# Patient Record
Sex: Male | Born: 1935 | Race: White | Hispanic: No | Marital: Married | Smoking: Former smoker
Health system: Southern US, Community
[De-identification: ages and names within clinical notes are randomized; demographics above are authoritative.]

## PROBLEM LIST (undated history)

## (undated) DIAGNOSIS — M109 Gout, unspecified: Secondary | ICD-10-CM

## (undated) DIAGNOSIS — K5790 Diverticulosis of intestine, part unspecified, without perforation or abscess without bleeding: Secondary | ICD-10-CM

## (undated) DIAGNOSIS — E7800 Pure hypercholesterolemia, unspecified: Secondary | ICD-10-CM

## (undated) DIAGNOSIS — J309 Allergic rhinitis, unspecified: Secondary | ICD-10-CM

## (undated) DIAGNOSIS — E039 Hypothyroidism, unspecified: Secondary | ICD-10-CM

## (undated) DIAGNOSIS — K219 Gastro-esophageal reflux disease without esophagitis: Secondary | ICD-10-CM

## (undated) DIAGNOSIS — M792 Neuralgia and neuritis, unspecified: Secondary | ICD-10-CM

## (undated) DIAGNOSIS — N411 Chronic prostatitis: Secondary | ICD-10-CM

## (undated) DIAGNOSIS — Z6827 Body mass index (BMI) 27.0-27.9, adult: Secondary | ICD-10-CM

## (undated) DIAGNOSIS — M159 Polyosteoarthritis, unspecified: Secondary | ICD-10-CM

## (undated) DIAGNOSIS — N189 Chronic kidney disease, unspecified: Secondary | ICD-10-CM

## (undated) DIAGNOSIS — I4891 Unspecified atrial fibrillation: Secondary | ICD-10-CM

## (undated) DIAGNOSIS — I10 Essential (primary) hypertension: Secondary | ICD-10-CM

## (undated) HISTORY — DX: Diverticulosis of intestine, part unspecified, without perforation or abscess without bleeding: K57.90

## (undated) HISTORY — DX: Essential (primary) hypertension: I10

## (undated) HISTORY — DX: Chronic kidney disease, unspecified: N18.9

## (undated) HISTORY — PX: APPENDECTOMY: SHX54

## (undated) HISTORY — DX: Chronic prostatitis: N41.1

## (undated) HISTORY — DX: Body mass index (BMI) 27.0-27.9, adult: Z68.27

## (undated) HISTORY — DX: Gout, unspecified: M10.9

## (undated) HISTORY — DX: Pure hypercholesterolemia, unspecified: E78.00

## (undated) HISTORY — DX: Polyosteoarthritis, unspecified: M15.9

## (undated) HISTORY — DX: Allergic rhinitis, unspecified: J30.9

## (undated) HISTORY — DX: Gastro-esophageal reflux disease without esophagitis: K21.9

## (undated) HISTORY — DX: Neuralgia and neuritis, unspecified: M79.2

## (undated) HISTORY — DX: Unspecified atrial fibrillation: I48.91

## (undated) HISTORY — DX: Hypothyroidism, unspecified: E03.9

---

## 2001-02-06 HISTORY — PX: CATARACT EXTRACTION: SUR2

## 2005-02-06 HISTORY — PX: CATARACT EXTRACTION: SUR2

## 2011-02-13 DIAGNOSIS — K219 Gastro-esophageal reflux disease without esophagitis: Secondary | ICD-10-CM | POA: Diagnosis not present

## 2011-02-13 DIAGNOSIS — M159 Polyosteoarthritis, unspecified: Secondary | ICD-10-CM | POA: Diagnosis not present

## 2011-02-13 DIAGNOSIS — I10 Essential (primary) hypertension: Secondary | ICD-10-CM | POA: Diagnosis not present

## 2011-02-13 DIAGNOSIS — N189 Chronic kidney disease, unspecified: Secondary | ICD-10-CM | POA: Diagnosis not present

## 2011-02-13 DIAGNOSIS — E039 Hypothyroidism, unspecified: Secondary | ICD-10-CM | POA: Diagnosis not present

## 2011-02-13 DIAGNOSIS — Z23 Encounter for immunization: Secondary | ICD-10-CM | POA: Diagnosis not present

## 2011-02-22 DIAGNOSIS — H905 Unspecified sensorineural hearing loss: Secondary | ICD-10-CM | POA: Diagnosis not present

## 2011-02-22 DIAGNOSIS — H7200 Central perforation of tympanic membrane, unspecified ear: Secondary | ICD-10-CM | POA: Diagnosis not present

## 2011-02-22 DIAGNOSIS — H6980 Other specified disorders of Eustachian tube, unspecified ear: Secondary | ICD-10-CM | POA: Diagnosis not present

## 2011-05-15 DIAGNOSIS — H908 Mixed conductive and sensorineural hearing loss, unspecified: Secondary | ICD-10-CM | POA: Diagnosis not present

## 2011-05-15 DIAGNOSIS — H6980 Other specified disorders of Eustachian tube, unspecified ear: Secondary | ICD-10-CM | POA: Diagnosis not present

## 2011-05-15 DIAGNOSIS — H73819 Atrophic flaccid tympanic membrane, unspecified ear: Secondary | ICD-10-CM | POA: Diagnosis not present

## 2011-05-17 DIAGNOSIS — E039 Hypothyroidism, unspecified: Secondary | ICD-10-CM | POA: Diagnosis not present

## 2011-05-17 DIAGNOSIS — M159 Polyosteoarthritis, unspecified: Secondary | ICD-10-CM | POA: Diagnosis not present

## 2011-05-17 DIAGNOSIS — I10 Essential (primary) hypertension: Secondary | ICD-10-CM | POA: Diagnosis not present

## 2011-05-17 DIAGNOSIS — E7800 Pure hypercholesterolemia, unspecified: Secondary | ICD-10-CM | POA: Diagnosis not present

## 2011-08-23 DIAGNOSIS — E7800 Pure hypercholesterolemia, unspecified: Secondary | ICD-10-CM | POA: Diagnosis not present

## 2011-08-23 DIAGNOSIS — M159 Polyosteoarthritis, unspecified: Secondary | ICD-10-CM | POA: Diagnosis not present

## 2011-08-23 DIAGNOSIS — E039 Hypothyroidism, unspecified: Secondary | ICD-10-CM | POA: Diagnosis not present

## 2011-08-23 DIAGNOSIS — I10 Essential (primary) hypertension: Secondary | ICD-10-CM | POA: Diagnosis not present

## 2011-08-23 DIAGNOSIS — Z23 Encounter for immunization: Secondary | ICD-10-CM | POA: Diagnosis not present

## 2011-11-22 DIAGNOSIS — Z6825 Body mass index (BMI) 25.0-25.9, adult: Secondary | ICD-10-CM | POA: Diagnosis not present

## 2011-11-22 DIAGNOSIS — E7800 Pure hypercholesterolemia, unspecified: Secondary | ICD-10-CM | POA: Diagnosis not present

## 2011-11-22 DIAGNOSIS — E039 Hypothyroidism, unspecified: Secondary | ICD-10-CM | POA: Diagnosis not present

## 2011-11-22 DIAGNOSIS — Z23 Encounter for immunization: Secondary | ICD-10-CM | POA: Diagnosis not present

## 2011-11-22 DIAGNOSIS — I10 Essential (primary) hypertension: Secondary | ICD-10-CM | POA: Diagnosis not present

## 2011-11-22 DIAGNOSIS — K219 Gastro-esophageal reflux disease without esophagitis: Secondary | ICD-10-CM | POA: Diagnosis not present

## 2011-11-22 DIAGNOSIS — M159 Polyosteoarthritis, unspecified: Secondary | ICD-10-CM | POA: Diagnosis not present

## 2011-12-01 DIAGNOSIS — H6690 Otitis media, unspecified, unspecified ear: Secondary | ICD-10-CM | POA: Diagnosis not present

## 2011-12-15 DIAGNOSIS — H905 Unspecified sensorineural hearing loss: Secondary | ICD-10-CM | POA: Diagnosis not present

## 2011-12-15 DIAGNOSIS — H6980 Other specified disorders of Eustachian tube, unspecified ear: Secondary | ICD-10-CM | POA: Diagnosis not present

## 2012-01-01 DIAGNOSIS — Z961 Presence of intraocular lens: Secondary | ICD-10-CM | POA: Diagnosis not present

## 2012-01-01 DIAGNOSIS — H353190 Nonexudative age-related macular degeneration, unspecified eye, stage unspecified: Secondary | ICD-10-CM | POA: Diagnosis not present

## 2012-02-23 DIAGNOSIS — Z1331 Encounter for screening for depression: Secondary | ICD-10-CM | POA: Diagnosis not present

## 2012-02-23 DIAGNOSIS — Z6825 Body mass index (BMI) 25.0-25.9, adult: Secondary | ICD-10-CM | POA: Diagnosis not present

## 2012-02-23 DIAGNOSIS — E7800 Pure hypercholesterolemia, unspecified: Secondary | ICD-10-CM | POA: Diagnosis not present

## 2012-02-23 DIAGNOSIS — Z9181 History of falling: Secondary | ICD-10-CM | POA: Diagnosis not present

## 2012-02-23 DIAGNOSIS — E039 Hypothyroidism, unspecified: Secondary | ICD-10-CM | POA: Diagnosis not present

## 2012-02-23 DIAGNOSIS — I10 Essential (primary) hypertension: Secondary | ICD-10-CM | POA: Diagnosis not present

## 2012-02-23 DIAGNOSIS — M159 Polyosteoarthritis, unspecified: Secondary | ICD-10-CM | POA: Diagnosis not present

## 2012-03-08 DIAGNOSIS — M159 Polyosteoarthritis, unspecified: Secondary | ICD-10-CM | POA: Diagnosis not present

## 2012-03-08 DIAGNOSIS — Z6825 Body mass index (BMI) 25.0-25.9, adult: Secondary | ICD-10-CM | POA: Diagnosis not present

## 2012-03-08 DIAGNOSIS — N189 Chronic kidney disease, unspecified: Secondary | ICD-10-CM | POA: Diagnosis not present

## 2012-03-08 DIAGNOSIS — E039 Hypothyroidism, unspecified: Secondary | ICD-10-CM | POA: Diagnosis not present

## 2012-05-31 DIAGNOSIS — N189 Chronic kidney disease, unspecified: Secondary | ICD-10-CM | POA: Diagnosis not present

## 2012-05-31 DIAGNOSIS — M159 Polyosteoarthritis, unspecified: Secondary | ICD-10-CM | POA: Diagnosis not present

## 2012-05-31 DIAGNOSIS — Z6825 Body mass index (BMI) 25.0-25.9, adult: Secondary | ICD-10-CM | POA: Diagnosis not present

## 2012-05-31 DIAGNOSIS — E039 Hypothyroidism, unspecified: Secondary | ICD-10-CM | POA: Diagnosis not present

## 2012-05-31 DIAGNOSIS — E7800 Pure hypercholesterolemia, unspecified: Secondary | ICD-10-CM | POA: Diagnosis not present

## 2012-05-31 DIAGNOSIS — I10 Essential (primary) hypertension: Secondary | ICD-10-CM | POA: Diagnosis not present

## 2012-07-12 DIAGNOSIS — Z6825 Body mass index (BMI) 25.0-25.9, adult: Secondary | ICD-10-CM | POA: Diagnosis not present

## 2012-07-12 DIAGNOSIS — M159 Polyosteoarthritis, unspecified: Secondary | ICD-10-CM | POA: Diagnosis not present

## 2012-07-12 DIAGNOSIS — N189 Chronic kidney disease, unspecified: Secondary | ICD-10-CM | POA: Diagnosis not present

## 2012-07-12 DIAGNOSIS — E039 Hypothyroidism, unspecified: Secondary | ICD-10-CM | POA: Diagnosis not present

## 2012-07-26 DIAGNOSIS — Z6825 Body mass index (BMI) 25.0-25.9, adult: Secondary | ICD-10-CM | POA: Diagnosis not present

## 2012-07-26 DIAGNOSIS — M109 Gout, unspecified: Secondary | ICD-10-CM | POA: Diagnosis not present

## 2012-07-26 DIAGNOSIS — E039 Hypothyroidism, unspecified: Secondary | ICD-10-CM | POA: Diagnosis not present

## 2012-07-26 DIAGNOSIS — L0291 Cutaneous abscess, unspecified: Secondary | ICD-10-CM | POA: Diagnosis not present

## 2012-07-26 DIAGNOSIS — L0390 Cellulitis, unspecified: Secondary | ICD-10-CM | POA: Diagnosis not present

## 2012-07-31 DIAGNOSIS — L0291 Cutaneous abscess, unspecified: Secondary | ICD-10-CM | POA: Diagnosis not present

## 2012-07-31 DIAGNOSIS — L02519 Cutaneous abscess of unspecified hand: Secondary | ICD-10-CM | POA: Diagnosis not present

## 2012-07-31 DIAGNOSIS — E039 Hypothyroidism, unspecified: Secondary | ICD-10-CM | POA: Diagnosis not present

## 2012-07-31 DIAGNOSIS — L0390 Cellulitis, unspecified: Secondary | ICD-10-CM | POA: Diagnosis not present

## 2012-07-31 DIAGNOSIS — Z6825 Body mass index (BMI) 25.0-25.9, adult: Secondary | ICD-10-CM | POA: Diagnosis not present

## 2012-07-31 DIAGNOSIS — M109 Gout, unspecified: Secondary | ICD-10-CM | POA: Diagnosis not present

## 2012-08-07 DIAGNOSIS — M109 Gout, unspecified: Secondary | ICD-10-CM | POA: Diagnosis not present

## 2012-08-07 DIAGNOSIS — IMO0002 Reserved for concepts with insufficient information to code with codable children: Secondary | ICD-10-CM | POA: Diagnosis not present

## 2012-08-07 DIAGNOSIS — E039 Hypothyroidism, unspecified: Secondary | ICD-10-CM | POA: Diagnosis not present

## 2012-08-07 DIAGNOSIS — M159 Polyosteoarthritis, unspecified: Secondary | ICD-10-CM | POA: Diagnosis not present

## 2012-09-04 DIAGNOSIS — IMO0002 Reserved for concepts with insufficient information to code with codable children: Secondary | ICD-10-CM | POA: Diagnosis not present

## 2012-09-04 DIAGNOSIS — E039 Hypothyroidism, unspecified: Secondary | ICD-10-CM | POA: Diagnosis not present

## 2012-09-04 DIAGNOSIS — I10 Essential (primary) hypertension: Secondary | ICD-10-CM | POA: Diagnosis not present

## 2012-09-04 DIAGNOSIS — E7800 Pure hypercholesterolemia, unspecified: Secondary | ICD-10-CM | POA: Diagnosis not present

## 2012-09-04 DIAGNOSIS — M159 Polyosteoarthritis, unspecified: Secondary | ICD-10-CM | POA: Diagnosis not present

## 2012-09-13 DIAGNOSIS — N183 Chronic kidney disease, stage 3 unspecified: Secondary | ICD-10-CM | POA: Diagnosis not present

## 2012-09-13 DIAGNOSIS — I129 Hypertensive chronic kidney disease with stage 1 through stage 4 chronic kidney disease, or unspecified chronic kidney disease: Secondary | ICD-10-CM | POA: Diagnosis not present

## 2012-10-08 DIAGNOSIS — L821 Other seborrheic keratosis: Secondary | ICD-10-CM | POA: Diagnosis not present

## 2012-10-08 DIAGNOSIS — L570 Actinic keratosis: Secondary | ICD-10-CM | POA: Diagnosis not present

## 2012-10-16 DIAGNOSIS — IMO0002 Reserved for concepts with insufficient information to code with codable children: Secondary | ICD-10-CM | POA: Diagnosis not present

## 2012-10-16 DIAGNOSIS — M159 Polyosteoarthritis, unspecified: Secondary | ICD-10-CM | POA: Diagnosis not present

## 2012-10-16 DIAGNOSIS — E039 Hypothyroidism, unspecified: Secondary | ICD-10-CM | POA: Diagnosis not present

## 2012-10-16 DIAGNOSIS — L0291 Cutaneous abscess, unspecified: Secondary | ICD-10-CM | POA: Diagnosis not present

## 2012-12-05 DIAGNOSIS — I10 Essential (primary) hypertension: Secondary | ICD-10-CM | POA: Diagnosis not present

## 2012-12-05 DIAGNOSIS — IMO0002 Reserved for concepts with insufficient information to code with codable children: Secondary | ICD-10-CM | POA: Diagnosis not present

## 2012-12-05 DIAGNOSIS — M159 Polyosteoarthritis, unspecified: Secondary | ICD-10-CM | POA: Diagnosis not present

## 2012-12-05 DIAGNOSIS — E7800 Pure hypercholesterolemia, unspecified: Secondary | ICD-10-CM | POA: Diagnosis not present

## 2012-12-05 DIAGNOSIS — E039 Hypothyroidism, unspecified: Secondary | ICD-10-CM | POA: Diagnosis not present

## 2012-12-05 DIAGNOSIS — Z23 Encounter for immunization: Secondary | ICD-10-CM | POA: Diagnosis not present

## 2013-01-09 DIAGNOSIS — H3581 Retinal edema: Secondary | ICD-10-CM | POA: Diagnosis not present

## 2013-01-09 DIAGNOSIS — H348192 Central retinal vein occlusion, unspecified eye, stable: Secondary | ICD-10-CM | POA: Diagnosis not present

## 2013-01-09 DIAGNOSIS — Z961 Presence of intraocular lens: Secondary | ICD-10-CM | POA: Diagnosis not present

## 2013-01-09 DIAGNOSIS — H35039 Hypertensive retinopathy, unspecified eye: Secondary | ICD-10-CM | POA: Diagnosis not present

## 2013-01-09 DIAGNOSIS — H353190 Nonexudative age-related macular degeneration, unspecified eye, stage unspecified: Secondary | ICD-10-CM | POA: Diagnosis not present

## 2013-02-10 DIAGNOSIS — M159 Polyosteoarthritis, unspecified: Secondary | ICD-10-CM | POA: Diagnosis not present

## 2013-02-10 DIAGNOSIS — E039 Hypothyroidism, unspecified: Secondary | ICD-10-CM | POA: Diagnosis not present

## 2013-02-10 DIAGNOSIS — E7800 Pure hypercholesterolemia, unspecified: Secondary | ICD-10-CM | POA: Diagnosis not present

## 2013-02-10 DIAGNOSIS — K219 Gastro-esophageal reflux disease without esophagitis: Secondary | ICD-10-CM | POA: Diagnosis not present

## 2013-02-13 DIAGNOSIS — H3581 Retinal edema: Secondary | ICD-10-CM | POA: Diagnosis not present

## 2013-02-13 DIAGNOSIS — D3130 Benign neoplasm of unspecified choroid: Secondary | ICD-10-CM | POA: Diagnosis not present

## 2013-02-13 DIAGNOSIS — H348192 Central retinal vein occlusion, unspecified eye, stable: Secondary | ICD-10-CM | POA: Diagnosis not present

## 2013-02-18 DIAGNOSIS — E039 Hypothyroidism, unspecified: Secondary | ICD-10-CM | POA: Diagnosis not present

## 2013-02-18 DIAGNOSIS — M109 Gout, unspecified: Secondary | ICD-10-CM | POA: Diagnosis not present

## 2013-02-18 DIAGNOSIS — M159 Polyosteoarthritis, unspecified: Secondary | ICD-10-CM | POA: Diagnosis not present

## 2013-02-18 DIAGNOSIS — Z1331 Encounter for screening for depression: Secondary | ICD-10-CM | POA: Diagnosis not present

## 2013-02-18 DIAGNOSIS — I10 Essential (primary) hypertension: Secondary | ICD-10-CM | POA: Diagnosis not present

## 2013-02-28 DIAGNOSIS — I10 Essential (primary) hypertension: Secondary | ICD-10-CM | POA: Diagnosis not present

## 2013-02-28 DIAGNOSIS — Z9181 History of falling: Secondary | ICD-10-CM | POA: Diagnosis not present

## 2013-02-28 DIAGNOSIS — M159 Polyosteoarthritis, unspecified: Secondary | ICD-10-CM | POA: Diagnosis not present

## 2013-02-28 DIAGNOSIS — K219 Gastro-esophageal reflux disease without esophagitis: Secondary | ICD-10-CM | POA: Diagnosis not present

## 2013-02-28 DIAGNOSIS — Z1331 Encounter for screening for depression: Secondary | ICD-10-CM | POA: Diagnosis not present

## 2013-02-28 DIAGNOSIS — E039 Hypothyroidism, unspecified: Secondary | ICD-10-CM | POA: Diagnosis not present

## 2013-03-19 DIAGNOSIS — H3581 Retinal edema: Secondary | ICD-10-CM | POA: Diagnosis not present

## 2013-03-19 DIAGNOSIS — H348192 Central retinal vein occlusion, unspecified eye, stable: Secondary | ICD-10-CM | POA: Diagnosis not present

## 2013-03-19 DIAGNOSIS — H35359 Cystoid macular degeneration, unspecified eye: Secondary | ICD-10-CM | POA: Diagnosis not present

## 2013-04-10 DIAGNOSIS — N189 Chronic kidney disease, unspecified: Secondary | ICD-10-CM | POA: Diagnosis not present

## 2013-04-10 DIAGNOSIS — E039 Hypothyroidism, unspecified: Secondary | ICD-10-CM | POA: Diagnosis not present

## 2013-04-10 DIAGNOSIS — I10 Essential (primary) hypertension: Secondary | ICD-10-CM | POA: Diagnosis not present

## 2013-04-10 DIAGNOSIS — M159 Polyosteoarthritis, unspecified: Secondary | ICD-10-CM | POA: Diagnosis not present

## 2013-04-23 DIAGNOSIS — H905 Unspecified sensorineural hearing loss: Secondary | ICD-10-CM | POA: Diagnosis not present

## 2013-04-23 DIAGNOSIS — H6980 Other specified disorders of Eustachian tube, unspecified ear: Secondary | ICD-10-CM | POA: Diagnosis not present

## 2013-04-23 DIAGNOSIS — H60509 Unspecified acute noninfective otitis externa, unspecified ear: Secondary | ICD-10-CM | POA: Diagnosis not present

## 2013-04-23 DIAGNOSIS — H9210 Otorrhea, unspecified ear: Secondary | ICD-10-CM | POA: Diagnosis not present

## 2013-04-30 DIAGNOSIS — H348192 Central retinal vein occlusion, unspecified eye, stable: Secondary | ICD-10-CM | POA: Diagnosis not present

## 2013-04-30 DIAGNOSIS — H3581 Retinal edema: Secondary | ICD-10-CM | POA: Diagnosis not present

## 2013-04-30 DIAGNOSIS — H3560 Retinal hemorrhage, unspecified eye: Secondary | ICD-10-CM | POA: Diagnosis not present

## 2013-04-30 DIAGNOSIS — H35359 Cystoid macular degeneration, unspecified eye: Secondary | ICD-10-CM | POA: Diagnosis not present

## 2013-05-16 DIAGNOSIS — H10509 Unspecified blepharoconjunctivitis, unspecified eye: Secondary | ICD-10-CM | POA: Diagnosis not present

## 2013-05-29 DIAGNOSIS — M109 Gout, unspecified: Secondary | ICD-10-CM | POA: Diagnosis not present

## 2013-05-29 DIAGNOSIS — E039 Hypothyroidism, unspecified: Secondary | ICD-10-CM | POA: Diagnosis not present

## 2013-05-29 DIAGNOSIS — M159 Polyosteoarthritis, unspecified: Secondary | ICD-10-CM | POA: Diagnosis not present

## 2013-05-29 DIAGNOSIS — I10 Essential (primary) hypertension: Secondary | ICD-10-CM | POA: Diagnosis not present

## 2013-06-04 DIAGNOSIS — H348192 Central retinal vein occlusion, unspecified eye, stable: Secondary | ICD-10-CM | POA: Diagnosis not present

## 2013-06-04 DIAGNOSIS — H3581 Retinal edema: Secondary | ICD-10-CM | POA: Diagnosis not present

## 2013-06-10 DIAGNOSIS — E7800 Pure hypercholesterolemia, unspecified: Secondary | ICD-10-CM | POA: Diagnosis not present

## 2013-06-10 DIAGNOSIS — I10 Essential (primary) hypertension: Secondary | ICD-10-CM | POA: Diagnosis not present

## 2013-06-10 DIAGNOSIS — M159 Polyosteoarthritis, unspecified: Secondary | ICD-10-CM | POA: Diagnosis not present

## 2013-06-10 DIAGNOSIS — K219 Gastro-esophageal reflux disease without esophagitis: Secondary | ICD-10-CM | POA: Diagnosis not present

## 2013-06-24 DIAGNOSIS — M159 Polyosteoarthritis, unspecified: Secondary | ICD-10-CM | POA: Diagnosis not present

## 2013-06-24 DIAGNOSIS — K219 Gastro-esophageal reflux disease without esophagitis: Secondary | ICD-10-CM | POA: Diagnosis not present

## 2013-06-24 DIAGNOSIS — I10 Essential (primary) hypertension: Secondary | ICD-10-CM | POA: Diagnosis not present

## 2013-06-24 DIAGNOSIS — N189 Chronic kidney disease, unspecified: Secondary | ICD-10-CM | POA: Diagnosis not present

## 2013-07-17 DIAGNOSIS — R197 Diarrhea, unspecified: Secondary | ICD-10-CM | POA: Diagnosis not present

## 2013-07-17 DIAGNOSIS — R634 Abnormal weight loss: Secondary | ICD-10-CM | POA: Diagnosis not present

## 2013-07-23 DIAGNOSIS — R197 Diarrhea, unspecified: Secondary | ICD-10-CM | POA: Diagnosis not present

## 2013-07-28 DIAGNOSIS — M109 Gout, unspecified: Secondary | ICD-10-CM | POA: Diagnosis not present

## 2013-07-28 DIAGNOSIS — E7800 Pure hypercholesterolemia, unspecified: Secondary | ICD-10-CM | POA: Diagnosis not present

## 2013-07-28 DIAGNOSIS — K219 Gastro-esophageal reflux disease without esophagitis: Secondary | ICD-10-CM | POA: Diagnosis not present

## 2013-07-28 DIAGNOSIS — E039 Hypothyroidism, unspecified: Secondary | ICD-10-CM | POA: Diagnosis not present

## 2013-07-30 DIAGNOSIS — H353290 Exudative age-related macular degeneration, unspecified eye, stage unspecified: Secondary | ICD-10-CM | POA: Diagnosis not present

## 2013-07-30 DIAGNOSIS — H35059 Retinal neovascularization, unspecified, unspecified eye: Secondary | ICD-10-CM | POA: Diagnosis not present

## 2013-08-19 DIAGNOSIS — E7800 Pure hypercholesterolemia, unspecified: Secondary | ICD-10-CM | POA: Diagnosis not present

## 2013-08-19 DIAGNOSIS — I10 Essential (primary) hypertension: Secondary | ICD-10-CM | POA: Diagnosis not present

## 2013-08-19 DIAGNOSIS — K219 Gastro-esophageal reflux disease without esophagitis: Secondary | ICD-10-CM | POA: Diagnosis not present

## 2013-08-19 DIAGNOSIS — E039 Hypothyroidism, unspecified: Secondary | ICD-10-CM | POA: Diagnosis not present

## 2013-08-21 DIAGNOSIS — R55 Syncope and collapse: Secondary | ICD-10-CM | POA: Diagnosis not present

## 2013-08-21 DIAGNOSIS — I4892 Unspecified atrial flutter: Secondary | ICD-10-CM | POA: Diagnosis not present

## 2013-08-21 DIAGNOSIS — R079 Chest pain, unspecified: Secondary | ICD-10-CM | POA: Diagnosis not present

## 2013-08-21 DIAGNOSIS — R072 Precordial pain: Secondary | ICD-10-CM | POA: Diagnosis not present

## 2013-08-21 DIAGNOSIS — R0789 Other chest pain: Secondary | ICD-10-CM | POA: Diagnosis not present

## 2013-08-21 DIAGNOSIS — R002 Palpitations: Secondary | ICD-10-CM | POA: Diagnosis not present

## 2013-08-21 DIAGNOSIS — I4891 Unspecified atrial fibrillation: Secondary | ICD-10-CM | POA: Diagnosis not present

## 2013-08-21 DIAGNOSIS — R0602 Shortness of breath: Secondary | ICD-10-CM | POA: Diagnosis not present

## 2013-08-22 DIAGNOSIS — R319 Hematuria, unspecified: Secondary | ICD-10-CM | POA: Diagnosis present

## 2013-08-22 DIAGNOSIS — M109 Gout, unspecified: Secondary | ICD-10-CM | POA: Diagnosis present

## 2013-08-22 DIAGNOSIS — R0789 Other chest pain: Secondary | ICD-10-CM | POA: Diagnosis not present

## 2013-08-22 DIAGNOSIS — I059 Rheumatic mitral valve disease, unspecified: Secondary | ICD-10-CM | POA: Diagnosis not present

## 2013-08-22 DIAGNOSIS — R911 Solitary pulmonary nodule: Secondary | ICD-10-CM | POA: Diagnosis present

## 2013-08-22 DIAGNOSIS — Z7982 Long term (current) use of aspirin: Secondary | ICD-10-CM | POA: Diagnosis not present

## 2013-08-22 DIAGNOSIS — I369 Nonrheumatic tricuspid valve disorder, unspecified: Secondary | ICD-10-CM | POA: Diagnosis not present

## 2013-08-22 DIAGNOSIS — R079 Chest pain, unspecified: Secondary | ICD-10-CM | POA: Diagnosis not present

## 2013-08-22 DIAGNOSIS — I4892 Unspecified atrial flutter: Secondary | ICD-10-CM | POA: Diagnosis not present

## 2013-08-22 DIAGNOSIS — I359 Nonrheumatic aortic valve disorder, unspecified: Secondary | ICD-10-CM | POA: Diagnosis not present

## 2013-08-22 DIAGNOSIS — I129 Hypertensive chronic kidney disease with stage 1 through stage 4 chronic kidney disease, or unspecified chronic kidney disease: Secondary | ICD-10-CM | POA: Diagnosis present

## 2013-08-22 DIAGNOSIS — R791 Abnormal coagulation profile: Secondary | ICD-10-CM | POA: Diagnosis present

## 2013-08-22 DIAGNOSIS — I4891 Unspecified atrial fibrillation: Secondary | ICD-10-CM | POA: Diagnosis not present

## 2013-08-22 DIAGNOSIS — Z7901 Long term (current) use of anticoagulants: Secondary | ICD-10-CM | POA: Diagnosis not present

## 2013-08-22 DIAGNOSIS — R0602 Shortness of breath: Secondary | ICD-10-CM | POA: Diagnosis not present

## 2013-08-22 DIAGNOSIS — I712 Thoracic aortic aneurysm, without rupture, unspecified: Secondary | ICD-10-CM | POA: Diagnosis not present

## 2013-08-22 DIAGNOSIS — R55 Syncope and collapse: Secondary | ICD-10-CM | POA: Diagnosis not present

## 2013-08-22 DIAGNOSIS — R072 Precordial pain: Secondary | ICD-10-CM | POA: Diagnosis not present

## 2013-08-22 DIAGNOSIS — E039 Hypothyroidism, unspecified: Secondary | ICD-10-CM | POA: Diagnosis present

## 2013-08-22 DIAGNOSIS — N189 Chronic kidney disease, unspecified: Secondary | ICD-10-CM | POA: Diagnosis not present

## 2013-08-22 DIAGNOSIS — Z87891 Personal history of nicotine dependence: Secondary | ICD-10-CM | POA: Diagnosis not present

## 2013-08-22 DIAGNOSIS — E785 Hyperlipidemia, unspecified: Secondary | ICD-10-CM | POA: Diagnosis not present

## 2013-08-22 DIAGNOSIS — J449 Chronic obstructive pulmonary disease, unspecified: Secondary | ICD-10-CM | POA: Diagnosis present

## 2013-08-22 DIAGNOSIS — I10 Essential (primary) hypertension: Secondary | ICD-10-CM | POA: Diagnosis not present

## 2013-08-22 DIAGNOSIS — E7800 Pure hypercholesterolemia, unspecified: Secondary | ICD-10-CM | POA: Diagnosis present

## 2013-08-22 DIAGNOSIS — R002 Palpitations: Secondary | ICD-10-CM | POA: Diagnosis not present

## 2013-08-22 DIAGNOSIS — M129 Arthropathy, unspecified: Secondary | ICD-10-CM | POA: Diagnosis present

## 2013-08-22 DIAGNOSIS — Z79899 Other long term (current) drug therapy: Secondary | ICD-10-CM | POA: Diagnosis not present

## 2013-08-26 DIAGNOSIS — I4891 Unspecified atrial fibrillation: Secondary | ICD-10-CM | POA: Diagnosis not present

## 2013-08-26 DIAGNOSIS — J449 Chronic obstructive pulmonary disease, unspecified: Secondary | ICD-10-CM | POA: Diagnosis not present

## 2013-08-26 DIAGNOSIS — R911 Solitary pulmonary nodule: Secondary | ICD-10-CM | POA: Diagnosis not present

## 2013-08-26 DIAGNOSIS — R609 Edema, unspecified: Secondary | ICD-10-CM | POA: Diagnosis not present

## 2013-09-01 ENCOUNTER — Telehealth: Payer: Self-pay | Admitting: Critical Care Medicine

## 2013-09-02 ENCOUNTER — Encounter: Payer: Self-pay | Admitting: Critical Care Medicine

## 2013-09-02 ENCOUNTER — Ambulatory Visit (INDEPENDENT_AMBULATORY_CARE_PROVIDER_SITE_OTHER): Payer: Self-pay | Admitting: Critical Care Medicine

## 2013-09-02 VITALS — BP 126/60 | HR 59 | Temp 98.2°F | Ht 71.0 in | Wt 180.8 lb

## 2013-09-02 DIAGNOSIS — R911 Solitary pulmonary nodule: Secondary | ICD-10-CM | POA: Insufficient documentation

## 2013-09-02 DIAGNOSIS — E7800 Pure hypercholesterolemia, unspecified: Secondary | ICD-10-CM | POA: Insufficient documentation

## 2013-09-02 DIAGNOSIS — J4520 Mild intermittent asthma, uncomplicated: Secondary | ICD-10-CM | POA: Insufficient documentation

## 2013-09-02 DIAGNOSIS — E039 Hypothyroidism, unspecified: Secondary | ICD-10-CM | POA: Insufficient documentation

## 2013-09-02 DIAGNOSIS — K219 Gastro-esophageal reflux disease without esophagitis: Secondary | ICD-10-CM | POA: Insufficient documentation

## 2013-09-02 DIAGNOSIS — I10 Essential (primary) hypertension: Secondary | ICD-10-CM | POA: Insufficient documentation

## 2013-09-02 DIAGNOSIS — J438 Other emphysema: Secondary | ICD-10-CM

## 2013-09-02 DIAGNOSIS — N189 Chronic kidney disease, unspecified: Secondary | ICD-10-CM | POA: Insufficient documentation

## 2013-09-02 DIAGNOSIS — M109 Gout, unspecified: Secondary | ICD-10-CM | POA: Insufficient documentation

## 2013-09-02 DIAGNOSIS — J432 Centrilobular emphysema: Secondary | ICD-10-CM

## 2013-09-02 DIAGNOSIS — I4891 Unspecified atrial fibrillation: Secondary | ICD-10-CM | POA: Insufficient documentation

## 2013-09-02 MED ORDER — TIOTROPIUM BROMIDE MONOHYDRATE 2.5 MCG/ACT IN AERS: INHALATION_SPRAY | RESPIRATORY_TRACT | Status: DC

## 2013-09-02 NOTE — Patient Instructions (Addendum)
Stop advair Only use xopenex as needed 1 puff every 6 hours Start Spiriva respimat two puff daily A lung function test will be obtained The lung nodule is likely benign.  NO additional scans needed Return 3 months

## 2013-09-02 NOTE — Progress Notes (Signed)
Subjective:    Patient ID: Jared Ross, male    DOB: 10-15-1922, 80 y.o.   MRN: [id number]  HPI Comments: Pt in hosp 1 week ago, found rapid HR.  Dx Copd prev x 46yrs. . Abn CXR.  Pt ex smoker since 1970   Shortness of Breath This is a chronic problem. The current episode started more than 1 year ago. Episode frequency: exertional only. The problem has been gradually worsening. Associated symptoms include leg swelling, orthopnea and wheezing. Pertinent negatives include no abdominal pain, chest pain, claudication, hemoptysis, sore throat, sputum production or syncope. The symptoms are aggravated by any activity and lying flat. Risk factors include smoking. He has tried beta agonist inhalers for the symptoms. The treatment provided moderate relief. His past medical history is significant for COPD. There is no history of allergies, aspirin allergies, asthma, bronchiolitis, CAD, DVT, a heart failure, PE or pneumonia.   Past Medical History  Diagnosis Date  . Gout   . Hypertrophic prostatitis   . Chronic kidney disease   . Generalized osteoarthritis   . Hypercholesterolemia   . HTN (hypertension)   . Esophageal reflux   . Hypothyroidism   . Allergic rhinitis   . Neuralgia and neuritis   . Diverticulosis   . Adult BMI 27.0-27.9 kg/sq m   . Atrial fibrillation      Family History  Problem Relation Age of Onset  . Pulmonary embolism Sister     deceased  . Coronary artery disease Father   . Coronary artery disease Mother   . Down syndrome Son      History   Social History  . Marital Status: N/A    Spouse Name: N/A    Number of Children: N/A  . Years of Education: N/A   Occupational History  . Not on file.   Social History Main Topics  . Smoking status: Former Smoker -- 1.00 packs/day for 30 years    Types: Cigarettes    Quit date: 08/04/1975  . Smokeless tobacco: Not on file  . Alcohol Use: No  . Drug Use: No  . Sexual Activity: Not on file   Other Topics Concern  .  Not on file   Social History Narrative   Lives with wife.   Retired x 26 yrs.    Worked in [employer].     Allergies  Allergen Reactions  . Lidocaine     blisters  . Penicillins     sweats     No outpatient prescriptions prior to visit.   No facility-administered medications prior to visit.       Review of Systems  HENT: Negative for sore throat, trouble swallowing and voice change.   Respiratory: Positive for shortness of breath and wheezing. Negative for hemoptysis and sputum production.   Cardiovascular: Positive for orthopnea and leg swelling. Negative for chest pain, claudication and syncope.  Gastrointestinal: Negative for abdominal pain.       Gas issues       Objective:   Physical Exam Filed Vitals:   09/02/13 1153  BP: 126/60  Pulse: 59  Temp: 98.2 F (36.8 C)  TempSrc: Oral  Height: 5\' 11"  (1.803 m)  Weight: 180 lb 12.8 oz (82.01 kg)  SpO2: 99%    Gen: Pleasant, well-nourished, in no distress,  normal affect  ENT: No lesions,  mouth clear,  oropharynx clear, no postnasal drip  Neck: No JVD, no TMG, no carotid bruits  Lungs: No use of accessory muscles,  no dullness to percussion, distant breath sounds  Cardiovascular: RRR, heart sounds normal, no murmur or gallops, no peripheral edema  Abdomen: soft and NT, no HSM,  BS normal  Musculoskeletal: No deformities, no cyanosis or clubbing  Neuro: alert, non focal  Skin: Warm, no lesions or rashes  No results found. CT scan of chest is reviewed Recent admission notes are reviewed from recent hospitalization at [facility]:   COPD with emphysema Chronic obstructive lung disease with primary emphysematous component Plan Stop advair Only use xopenex as needed 1 puff every 6 hours Start Spiriva respimat two puff daily A lung function test will be obtained   Lung nodule Small 5 mm nodule right lower lobe superior segment which appears  benign Plan No additional CT scan he indicated   Updated Medication List Outpatient Encounter Prescriptions as of 09/02/2013  Medication Sig  . alum & mag hydroxide-simeth (MAALOX/MYLANTA) 200-200-20 MG/5ML suspension Take by mouth every 6 (six) hours as needed for indigestion or heartburn.  Marland Kitchen apixaban (ELIQUIS) 5 MG TABS tablet Take 5 mg by mouth 2 (two) times daily.  Marland Kitchen atorvastatin (LIPITOR) 20 MG tablet Take 20 mg by mouth daily.  Marland Kitchen diltiazem (CARDIZEM CD) 180 MG 24 hr capsule Take 180 mg by mouth. Take 1 tablet twice a day by mouth  . furosemide (LASIX) 20 MG tablet Take 20 mg by mouth. Take 1 tablet by mouth as needed  . levalbuterol (XOPENEX HFA) 45 MCG/ACT inhaler Inhale 1 puff into the lungs every 4 (four) hours as needed for wheezing.  Marland Kitchen levothyroxine (SYNTHROID, LEVOTHROID) 25 MCG tablet Take 25 mcg by mouth daily before breakfast.  . Multiple Vitamins-Minerals (ICAPS MV PO) Take by mouth daily.  Marland Kitchen omeprazole (PRILOSEC) 40 MG capsule Take 40 mg by mouth daily.  . [DISCONTINUED] Fluticasone-Salmeterol (ADVAIR) 100-50 MCG/DOSE AEPB Inhale 1 puff into the lungs 2 (two) times daily.   . Tiotropium Bromide Monohydrate (SPIRIVA RESPIMAT) 2.5 MCG/ACT AERS Two puff daily

## 2013-09-02 NOTE — Telephone Encounter (Signed)
Called spoke with pt. PW had an opening in [place] this AM. appt scheduled. Nothing further needed

## 2013-09-02 NOTE — Assessment & Plan Note (Signed)
Small 5 mm nodule right lower lobe superior segment which appears benign Plan No additional CT scan he indicated

## 2013-09-02 NOTE — Assessment & Plan Note (Signed)
Chronic obstructive lung disease with primary emphysematous component Plan Stop advair Only use xopenex as needed 1 puff every 6 hours Start Spiriva respimat two puff daily A lung function test will be obtained

## 2013-09-04 DIAGNOSIS — F17200 Nicotine dependence, unspecified, uncomplicated: Secondary | ICD-10-CM | POA: Diagnosis not present

## 2013-09-04 DIAGNOSIS — J449 Chronic obstructive pulmonary disease, unspecified: Secondary | ICD-10-CM | POA: Diagnosis not present

## 2013-09-04 LAB — PULMONARY FUNCTION TEST

## 2013-09-09 DIAGNOSIS — N189 Chronic kidney disease, unspecified: Secondary | ICD-10-CM | POA: Diagnosis not present

## 2013-09-09 DIAGNOSIS — J449 Chronic obstructive pulmonary disease, unspecified: Secondary | ICD-10-CM | POA: Diagnosis not present

## 2013-09-09 DIAGNOSIS — E785 Hyperlipidemia, unspecified: Secondary | ICD-10-CM | POA: Diagnosis not present

## 2013-09-09 DIAGNOSIS — I4892 Unspecified atrial flutter: Secondary | ICD-10-CM | POA: Diagnosis not present

## 2013-09-09 DIAGNOSIS — R0602 Shortness of breath: Secondary | ICD-10-CM | POA: Diagnosis not present

## 2013-09-10 DIAGNOSIS — M159 Polyosteoarthritis, unspecified: Secondary | ICD-10-CM | POA: Diagnosis not present

## 2013-09-10 DIAGNOSIS — I10 Essential (primary) hypertension: Secondary | ICD-10-CM | POA: Diagnosis not present

## 2013-09-10 DIAGNOSIS — I4891 Unspecified atrial fibrillation: Secondary | ICD-10-CM | POA: Diagnosis not present

## 2013-09-10 DIAGNOSIS — E039 Hypothyroidism, unspecified: Secondary | ICD-10-CM | POA: Diagnosis not present

## 2013-09-11 ENCOUNTER — Institutional Professional Consult (permissible substitution): Payer: Self-pay | Admitting: Pulmonary Disease

## 2013-09-11 ENCOUNTER — Telehealth: Payer: Self-pay | Admitting: *Deleted

## 2013-09-11 NOTE — Telephone Encounter (Signed)
Pt had PFT done at [facility] on 09/04/13.  PW has reviewed these results and the PFT was normal.    I have called and spoke with pt and he is aware of results.  Nothing further is needed.

## 2013-10-01 ENCOUNTER — Encounter: Payer: Self-pay | Admitting: Critical Care Medicine

## 2013-10-08 DIAGNOSIS — H35359 Cystoid macular degeneration, unspecified eye: Secondary | ICD-10-CM | POA: Diagnosis not present

## 2013-10-08 DIAGNOSIS — H3560 Retinal hemorrhage, unspecified eye: Secondary | ICD-10-CM | POA: Diagnosis not present

## 2013-10-08 DIAGNOSIS — H3581 Retinal edema: Secondary | ICD-10-CM | POA: Diagnosis not present

## 2013-10-08 DIAGNOSIS — H348192 Central retinal vein occlusion, unspecified eye, stable: Secondary | ICD-10-CM | POA: Diagnosis not present

## 2013-11-03 DIAGNOSIS — N183 Chronic kidney disease, stage 3 unspecified: Secondary | ICD-10-CM | POA: Diagnosis not present

## 2013-11-03 DIAGNOSIS — I10 Essential (primary) hypertension: Secondary | ICD-10-CM | POA: Diagnosis not present

## 2013-11-03 DIAGNOSIS — E785 Hyperlipidemia, unspecified: Secondary | ICD-10-CM | POA: Diagnosis not present

## 2013-11-11 DIAGNOSIS — T63481A Toxic effect of venom of other arthropod, accidental (unintentional), initial encounter: Secondary | ICD-10-CM | POA: Diagnosis not present

## 2013-11-11 DIAGNOSIS — E780 Pure hypercholesterolemia: Secondary | ICD-10-CM | POA: Diagnosis not present

## 2013-11-11 DIAGNOSIS — J449 Chronic obstructive pulmonary disease, unspecified: Secondary | ICD-10-CM | POA: Diagnosis not present

## 2013-11-11 DIAGNOSIS — Z79899 Other long term (current) drug therapy: Secondary | ICD-10-CM | POA: Diagnosis not present

## 2013-11-11 DIAGNOSIS — E039 Hypothyroidism, unspecified: Secondary | ICD-10-CM | POA: Diagnosis not present

## 2013-11-11 DIAGNOSIS — T63441A Toxic effect of venom of bees, accidental (unintentional), initial encounter: Secondary | ICD-10-CM | POA: Diagnosis not present

## 2013-11-11 DIAGNOSIS — R600 Localized edema: Secondary | ICD-10-CM | POA: Diagnosis not present

## 2013-11-11 DIAGNOSIS — I129 Hypertensive chronic kidney disease with stage 1 through stage 4 chronic kidney disease, or unspecified chronic kidney disease: Secondary | ICD-10-CM | POA: Diagnosis not present

## 2013-11-11 DIAGNOSIS — I10 Essential (primary) hypertension: Secondary | ICD-10-CM | POA: Diagnosis not present

## 2013-11-11 DIAGNOSIS — K219 Gastro-esophageal reflux disease without esophagitis: Secondary | ICD-10-CM | POA: Diagnosis not present

## 2013-11-11 DIAGNOSIS — Z23 Encounter for immunization: Secondary | ICD-10-CM | POA: Diagnosis not present

## 2013-11-11 DIAGNOSIS — I4892 Unspecified atrial flutter: Secondary | ICD-10-CM | POA: Diagnosis not present

## 2013-11-11 DIAGNOSIS — I4891 Unspecified atrial fibrillation: Secondary | ICD-10-CM | POA: Diagnosis not present

## 2013-11-11 DIAGNOSIS — N189 Chronic kidney disease, unspecified: Secondary | ICD-10-CM | POA: Diagnosis not present

## 2013-11-12 DIAGNOSIS — I4891 Unspecified atrial fibrillation: Secondary | ICD-10-CM | POA: Diagnosis not present

## 2013-11-12 DIAGNOSIS — K219 Gastro-esophageal reflux disease without esophagitis: Secondary | ICD-10-CM | POA: Diagnosis not present

## 2013-11-12 DIAGNOSIS — R911 Solitary pulmonary nodule: Secondary | ICD-10-CM | POA: Diagnosis not present

## 2013-11-12 DIAGNOSIS — I10 Essential (primary) hypertension: Secondary | ICD-10-CM | POA: Diagnosis not present

## 2013-11-12 DIAGNOSIS — M159 Polyosteoarthritis, unspecified: Secondary | ICD-10-CM | POA: Diagnosis not present

## 2013-11-12 DIAGNOSIS — E780 Pure hypercholesterolemia: Secondary | ICD-10-CM | POA: Diagnosis not present

## 2013-11-12 DIAGNOSIS — H34811 Central retinal vein occlusion, right eye: Secondary | ICD-10-CM | POA: Diagnosis not present

## 2013-11-12 DIAGNOSIS — R600 Localized edema: Secondary | ICD-10-CM | POA: Diagnosis not present

## 2013-11-12 DIAGNOSIS — M109 Gout, unspecified: Secondary | ICD-10-CM | POA: Diagnosis not present

## 2013-11-18 DIAGNOSIS — I10 Essential (primary) hypertension: Secondary | ICD-10-CM | POA: Diagnosis not present

## 2013-11-18 DIAGNOSIS — I4891 Unspecified atrial fibrillation: Secondary | ICD-10-CM | POA: Diagnosis not present

## 2013-11-18 DIAGNOSIS — M159 Polyosteoarthritis, unspecified: Secondary | ICD-10-CM | POA: Diagnosis not present

## 2013-11-18 DIAGNOSIS — E780 Pure hypercholesterolemia: Secondary | ICD-10-CM | POA: Diagnosis not present

## 2013-11-18 DIAGNOSIS — R5382 Chronic fatigue, unspecified: Secondary | ICD-10-CM | POA: Diagnosis not present

## 2013-11-25 DIAGNOSIS — J449 Chronic obstructive pulmonary disease, unspecified: Secondary | ICD-10-CM | POA: Diagnosis not present

## 2013-11-25 DIAGNOSIS — E780 Pure hypercholesterolemia: Secondary | ICD-10-CM | POA: Diagnosis not present

## 2013-11-25 DIAGNOSIS — D72828 Other elevated white blood cell count: Secondary | ICD-10-CM | POA: Diagnosis not present

## 2013-11-25 DIAGNOSIS — N183 Chronic kidney disease, stage 3 (moderate): Secondary | ICD-10-CM | POA: Diagnosis not present

## 2013-11-25 DIAGNOSIS — M159 Polyosteoarthritis, unspecified: Secondary | ICD-10-CM | POA: Diagnosis not present

## 2013-11-25 DIAGNOSIS — I4891 Unspecified atrial fibrillation: Secondary | ICD-10-CM | POA: Diagnosis not present

## 2013-11-25 DIAGNOSIS — R911 Solitary pulmonary nodule: Secondary | ICD-10-CM | POA: Diagnosis not present

## 2013-11-25 DIAGNOSIS — M109 Gout, unspecified: Secondary | ICD-10-CM | POA: Diagnosis not present

## 2013-11-25 DIAGNOSIS — E039 Hypothyroidism, unspecified: Secondary | ICD-10-CM | POA: Diagnosis not present

## 2013-11-27 DIAGNOSIS — I5032 Chronic diastolic (congestive) heart failure: Secondary | ICD-10-CM | POA: Diagnosis not present

## 2013-11-27 DIAGNOSIS — N189 Chronic kidney disease, unspecified: Secondary | ICD-10-CM | POA: Diagnosis not present

## 2013-11-27 DIAGNOSIS — I10 Essential (primary) hypertension: Secondary | ICD-10-CM | POA: Diagnosis not present

## 2013-11-27 DIAGNOSIS — J449 Chronic obstructive pulmonary disease, unspecified: Secondary | ICD-10-CM | POA: Diagnosis not present

## 2013-11-27 DIAGNOSIS — I4892 Unspecified atrial flutter: Secondary | ICD-10-CM | POA: Diagnosis not present

## 2013-12-10 DIAGNOSIS — M159 Polyosteoarthritis, unspecified: Secondary | ICD-10-CM | POA: Diagnosis not present

## 2013-12-10 DIAGNOSIS — J449 Chronic obstructive pulmonary disease, unspecified: Secondary | ICD-10-CM | POA: Diagnosis not present

## 2013-12-10 DIAGNOSIS — N183 Chronic kidney disease, stage 3 (moderate): Secondary | ICD-10-CM | POA: Diagnosis not present

## 2013-12-10 DIAGNOSIS — R911 Solitary pulmonary nodule: Secondary | ICD-10-CM | POA: Diagnosis not present

## 2013-12-10 DIAGNOSIS — E780 Pure hypercholesterolemia: Secondary | ICD-10-CM | POA: Diagnosis not present

## 2013-12-10 DIAGNOSIS — M109 Gout, unspecified: Secondary | ICD-10-CM | POA: Diagnosis not present

## 2013-12-10 DIAGNOSIS — E039 Hypothyroidism, unspecified: Secondary | ICD-10-CM | POA: Diagnosis not present

## 2013-12-10 DIAGNOSIS — I4891 Unspecified atrial fibrillation: Secondary | ICD-10-CM | POA: Diagnosis not present

## 2013-12-11 DIAGNOSIS — C44519 Basal cell carcinoma of skin of other part of trunk: Secondary | ICD-10-CM | POA: Diagnosis not present

## 2013-12-11 DIAGNOSIS — L570 Actinic keratosis: Secondary | ICD-10-CM | POA: Diagnosis not present

## 2013-12-16 ENCOUNTER — Encounter: Payer: Self-pay | Admitting: Critical Care Medicine

## 2013-12-16 ENCOUNTER — Ambulatory Visit (INDEPENDENT_AMBULATORY_CARE_PROVIDER_SITE_OTHER): Payer: Self-pay | Admitting: Critical Care Medicine

## 2013-12-16 VITALS — BP 126/64 | HR 60 | Temp 97.0°F | Ht 70.0 in | Wt 176.0 lb

## 2013-12-16 DIAGNOSIS — J4520 Mild intermittent asthma, uncomplicated: Secondary | ICD-10-CM | POA: Diagnosis not present

## 2013-12-16 NOTE — Assessment & Plan Note (Signed)
Mild intermittent asthma without evidence of COPD her primary emphysematous component Plan Discontinue Spiriva Continue Xopenex as needed Return as needed

## 2013-12-16 NOTE — Patient Instructions (Signed)
Stop spiriva Stay on xopenex as needed Return as needed

## 2013-12-16 NOTE — Progress Notes (Signed)
   Subjective:    Patient ID: Jared Ross, male    DOB: [birth year], 80 y.o.   MRN: [id number]  HPI Comments: Pt in hosp 1 week ago, found rapid HR.  Dx Copd prev x 36yrs. . Abn CXR.  Pt ex smoker since 1970   12/16/2013 Chief Complaint  Patient presents with  . Follow-up    c/o dry mouth,sob with exertion occass.,dry cough occass.,wheezing,has occass. pain on upper left side of chest   At last ov we rec start spiriva and prn xopenex.  PFTs done: normal. Notes mild dry mouth.  No improvement with spiriva.  ? If xopenex helps more.  HR has been better. Occ wheezing.  Cough is occ prod clear.   Review of Systems  HENT: Negative for trouble swallowing and voice change.   Gastrointestinal:       Gas issues       Objective:   Physical Exam Filed Vitals:   12/16/13 1131  BP: 126/64  Pulse: 60  Temp: 97 F (36.1 C)  TempSrc: Oral  Height: 5\' 10"  (1.778 m)  Weight: 176 lb (79.833 kg)  SpO2: 97%    Gen: Pleasant, well-nourished, in no distress,  normal affect  ENT: No lesions,  mouth clear,  oropharynx clear, no postnasal drip  Neck: No JVD, no TMG, no carotid bruits  Lungs: No use of accessory muscles, no dullness to percussion, distant breath sounds  Cardiovascular: RRR, heart sounds normal, no murmur or gallops, no peripheral edema  Abdomen: soft and NT, no HSM,  BS normal  Musculoskeletal: No deformities, no cyanosis or clubbing  Neuro: alert, non focal  Skin: Warm, no lesions or rashes  No results found. Spirometry obtained July 2015 was normal      Assessment & Plan:   Asthma, mild intermittent Mild intermittent asthma without evidence of COPD her primary emphysematous component Plan Discontinue Spiriva Continue Xopenex as needed Return as needed    Updated Medication List Outpatient Encounter Prescriptions as of 12/16/2013  Medication Sig  . alum & mag hydroxide-simeth (MAALOX/MYLANTA) 200-200-20 MG/5ML suspension Take by mouth every 6 (six)  hours as needed for indigestion or heartburn.  Marland Kitchen apixaban (ELIQUIS) 2.5 MG TABS tablet Take 2.5 mg by mouth 2 (two) times daily.  Marland Kitchen atorvastatin (LIPITOR) 20 MG tablet Take 20 mg by mouth daily.  Marland Kitchen diltiazem (CARDIZEM) 120 MG tablet Take 120 mg by mouth. Take 1 tablet two times a day by mouth  . EPIPEN 2-PAK 0.3 MG/0.3ML SOAJ injection As needed  . levalbuterol (XOPENEX HFA) 45 MCG/ACT inhaler Inhale 1 puff into the lungs every 4 (four) hours as needed for wheezing.  Marland Kitchen levothyroxine (SYNTHROID, LEVOTHROID) 25 MCG tablet Take 25 mcg by mouth daily before breakfast.  . Multiple Vitamins-Minerals (ICAPS MV PO) Take by mouth daily.  Marland Kitchen omeprazole (PRILOSEC) 40 MG capsule Take 40 mg by mouth daily.  Marland Kitchen Propylene Glycol (SYSTANE BALANCE OP) Apply to eye. Use in right eye  . [DISCONTINUED] Tiotropium Bromide Monohydrate (SPIRIVA RESPIMAT) 2.5 MCG/ACT AERS Two puff daily  . [DISCONTINUED] apixaban (ELIQUIS) 5 MG TABS tablet Take 5 mg by mouth 2 (two) times daily.  . [DISCONTINUED] diltiazem (CARDIZEM CD) 180 MG 24 hr capsule Take 180 mg by mouth. Take 1 tablet twice a day by mouth  . [DISCONTINUED] furosemide (LASIX) 20 MG tablet Take 20 mg by mouth. Take 1 tablet by mouth as needed

## 2013-12-17 DIAGNOSIS — H3581 Retinal edema: Secondary | ICD-10-CM | POA: Diagnosis not present

## 2013-12-17 DIAGNOSIS — H34811 Central retinal vein occlusion, right eye: Secondary | ICD-10-CM | POA: Diagnosis not present

## 2013-12-24 DIAGNOSIS — K529 Noninfective gastroenteritis and colitis, unspecified: Secondary | ICD-10-CM | POA: Diagnosis not present

## 2013-12-24 DIAGNOSIS — I4891 Unspecified atrial fibrillation: Secondary | ICD-10-CM | POA: Diagnosis not present

## 2013-12-30 DIAGNOSIS — C44719 Basal cell carcinoma of skin of left lower limb, including hip: Secondary | ICD-10-CM | POA: Diagnosis not present

## 2014-01-15 DIAGNOSIS — R1032 Left lower quadrant pain: Secondary | ICD-10-CM | POA: Diagnosis not present

## 2014-01-15 DIAGNOSIS — R634 Abnormal weight loss: Secondary | ICD-10-CM | POA: Diagnosis not present

## 2014-01-20 DIAGNOSIS — R634 Abnormal weight loss: Secondary | ICD-10-CM | POA: Diagnosis not present

## 2014-01-20 DIAGNOSIS — R194 Change in bowel habit: Secondary | ICD-10-CM | POA: Diagnosis not present

## 2014-01-20 DIAGNOSIS — K5730 Diverticulosis of large intestine without perforation or abscess without bleeding: Secondary | ICD-10-CM | POA: Diagnosis not present

## 2014-01-22 DIAGNOSIS — K219 Gastro-esophageal reflux disease without esophagitis: Secondary | ICD-10-CM | POA: Diagnosis not present

## 2014-01-22 DIAGNOSIS — R600 Localized edema: Secondary | ICD-10-CM | POA: Diagnosis not present

## 2014-01-22 DIAGNOSIS — M159 Polyosteoarthritis, unspecified: Secondary | ICD-10-CM | POA: Diagnosis not present

## 2014-01-22 DIAGNOSIS — I10 Essential (primary) hypertension: Secondary | ICD-10-CM | POA: Diagnosis not present

## 2014-01-22 DIAGNOSIS — R911 Solitary pulmonary nodule: Secondary | ICD-10-CM | POA: Diagnosis not present

## 2014-01-22 DIAGNOSIS — E039 Hypothyroidism, unspecified: Secondary | ICD-10-CM | POA: Diagnosis not present

## 2014-01-22 DIAGNOSIS — E780 Pure hypercholesterolemia: Secondary | ICD-10-CM | POA: Diagnosis not present

## 2014-01-22 DIAGNOSIS — M109 Gout, unspecified: Secondary | ICD-10-CM | POA: Diagnosis not present

## 2014-02-05 DIAGNOSIS — H35351 Cystoid macular degeneration, right eye: Secondary | ICD-10-CM | POA: Diagnosis not present

## 2014-02-05 DIAGNOSIS — H3581 Retinal edema: Secondary | ICD-10-CM | POA: Diagnosis not present

## 2014-02-05 DIAGNOSIS — H34811 Central retinal vein occlusion, right eye: Secondary | ICD-10-CM | POA: Diagnosis not present

## 2014-03-12 DIAGNOSIS — R197 Diarrhea, unspecified: Secondary | ICD-10-CM | POA: Diagnosis not present

## 2014-03-12 DIAGNOSIS — R634 Abnormal weight loss: Secondary | ICD-10-CM | POA: Diagnosis not present

## 2014-03-17 DIAGNOSIS — I5032 Chronic diastolic (congestive) heart failure: Secondary | ICD-10-CM | POA: Diagnosis not present

## 2014-03-17 DIAGNOSIS — R079 Chest pain, unspecified: Secondary | ICD-10-CM | POA: Diagnosis not present

## 2014-03-17 DIAGNOSIS — N189 Chronic kidney disease, unspecified: Secondary | ICD-10-CM | POA: Diagnosis not present

## 2014-03-17 DIAGNOSIS — R42 Dizziness and giddiness: Secondary | ICD-10-CM | POA: Diagnosis not present

## 2014-11-12 DIAGNOSIS — H34811 Central retinal vein occlusion, right eye, with macular edema: Secondary | ICD-10-CM | POA: Diagnosis not present

## 2015-01-14 DIAGNOSIS — H34811 Central retinal vein occlusion, right eye, with macular edema: Secondary | ICD-10-CM | POA: Diagnosis not present

## 2015-01-28 DIAGNOSIS — E7800 Pure hypercholesterolemia, unspecified: Secondary | ICD-10-CM | POA: Diagnosis not present

## 2015-01-28 DIAGNOSIS — R600 Localized edema: Secondary | ICD-10-CM | POA: Diagnosis not present

## 2015-01-28 DIAGNOSIS — M159 Polyosteoarthritis, unspecified: Secondary | ICD-10-CM | POA: Diagnosis not present

## 2015-01-28 DIAGNOSIS — Z9181 History of falling: Secondary | ICD-10-CM | POA: Diagnosis not present

## 2015-01-28 DIAGNOSIS — R911 Solitary pulmonary nodule: Secondary | ICD-10-CM | POA: Diagnosis not present

## 2015-01-28 DIAGNOSIS — J449 Chronic obstructive pulmonary disease, unspecified: Secondary | ICD-10-CM | POA: Diagnosis not present

## 2015-01-28 DIAGNOSIS — I10 Essential (primary) hypertension: Secondary | ICD-10-CM | POA: Diagnosis not present

## 2015-01-28 DIAGNOSIS — E039 Hypothyroidism, unspecified: Secondary | ICD-10-CM | POA: Diagnosis not present

## 2015-01-28 DIAGNOSIS — N183 Chronic kidney disease, stage 3 (moderate): Secondary | ICD-10-CM | POA: Diagnosis not present

## 2015-01-28 DIAGNOSIS — I4891 Unspecified atrial fibrillation: Secondary | ICD-10-CM | POA: Diagnosis not present

## 2015-03-11 DIAGNOSIS — H34831 Tributary (branch) retinal vein occlusion, right eye, with macular edema: Secondary | ICD-10-CM | POA: Diagnosis not present

## 2015-03-11 DIAGNOSIS — H34811 Central retinal vein occlusion, right eye, with macular edema: Secondary | ICD-10-CM | POA: Diagnosis not present

## 2015-04-29 DIAGNOSIS — L821 Other seborrheic keratosis: Secondary | ICD-10-CM | POA: Diagnosis not present

## 2015-04-29 DIAGNOSIS — D1801 Hemangioma of skin and subcutaneous tissue: Secondary | ICD-10-CM | POA: Diagnosis not present

## 2015-04-29 DIAGNOSIS — B351 Tinea unguium: Secondary | ICD-10-CM | POA: Diagnosis not present

## 2015-04-30 DIAGNOSIS — I4891 Unspecified atrial fibrillation: Secondary | ICD-10-CM | POA: Diagnosis not present

## 2015-04-30 DIAGNOSIS — J449 Chronic obstructive pulmonary disease, unspecified: Secondary | ICD-10-CM | POA: Diagnosis not present

## 2015-04-30 DIAGNOSIS — E7800 Pure hypercholesterolemia, unspecified: Secondary | ICD-10-CM | POA: Diagnosis not present

## 2015-04-30 DIAGNOSIS — R911 Solitary pulmonary nodule: Secondary | ICD-10-CM | POA: Diagnosis not present

## 2015-04-30 DIAGNOSIS — M159 Polyosteoarthritis, unspecified: Secondary | ICD-10-CM | POA: Diagnosis not present

## 2015-04-30 DIAGNOSIS — I10 Essential (primary) hypertension: Secondary | ICD-10-CM | POA: Diagnosis not present

## 2015-04-30 DIAGNOSIS — N183 Chronic kidney disease, stage 3 (moderate): Secondary | ICD-10-CM | POA: Diagnosis not present

## 2015-04-30 DIAGNOSIS — E039 Hypothyroidism, unspecified: Secondary | ICD-10-CM | POA: Diagnosis not present

## 2015-04-30 DIAGNOSIS — R600 Localized edema: Secondary | ICD-10-CM | POA: Diagnosis not present

## 2015-04-30 DIAGNOSIS — Z1389 Encounter for screening for other disorder: Secondary | ICD-10-CM | POA: Diagnosis not present

## 2015-05-03 DIAGNOSIS — I10 Essential (primary) hypertension: Secondary | ICD-10-CM | POA: Diagnosis not present

## 2015-05-03 DIAGNOSIS — I480 Paroxysmal atrial fibrillation: Secondary | ICD-10-CM | POA: Diagnosis not present

## 2015-05-03 DIAGNOSIS — R0789 Other chest pain: Secondary | ICD-10-CM | POA: Diagnosis not present

## 2015-05-03 DIAGNOSIS — N183 Chronic kidney disease, stage 3 (moderate): Secondary | ICD-10-CM | POA: Diagnosis not present

## 2015-05-20 DIAGNOSIS — H34811 Central retinal vein occlusion, right eye, with macular edema: Secondary | ICD-10-CM | POA: Diagnosis not present

## 2015-05-20 DIAGNOSIS — H348122 Central retinal vein occlusion, left eye, stable: Secondary | ICD-10-CM | POA: Diagnosis not present

## 2015-08-03 DIAGNOSIS — J449 Chronic obstructive pulmonary disease, unspecified: Secondary | ICD-10-CM | POA: Diagnosis not present

## 2015-08-03 DIAGNOSIS — N183 Chronic kidney disease, stage 3 (moderate): Secondary | ICD-10-CM | POA: Diagnosis not present

## 2015-08-03 DIAGNOSIS — I4891 Unspecified atrial fibrillation: Secondary | ICD-10-CM | POA: Diagnosis not present

## 2015-08-03 DIAGNOSIS — M159 Polyosteoarthritis, unspecified: Secondary | ICD-10-CM | POA: Diagnosis not present

## 2015-08-03 DIAGNOSIS — I10 Essential (primary) hypertension: Secondary | ICD-10-CM | POA: Diagnosis not present

## 2015-08-03 DIAGNOSIS — R911 Solitary pulmonary nodule: Secondary | ICD-10-CM | POA: Diagnosis not present

## 2015-08-03 DIAGNOSIS — E7800 Pure hypercholesterolemia, unspecified: Secondary | ICD-10-CM | POA: Diagnosis not present

## 2015-08-03 DIAGNOSIS — R600 Localized edema: Secondary | ICD-10-CM | POA: Diagnosis not present

## 2015-08-03 DIAGNOSIS — E039 Hypothyroidism, unspecified: Secondary | ICD-10-CM | POA: Diagnosis not present

## 2015-08-03 DIAGNOSIS — K219 Gastro-esophageal reflux disease without esophagitis: Secondary | ICD-10-CM | POA: Diagnosis not present

## 2015-08-16 DIAGNOSIS — Z9629 Presence of other otological and audiological implants: Secondary | ICD-10-CM | POA: Diagnosis not present

## 2015-08-19 DIAGNOSIS — H34811 Central retinal vein occlusion, right eye, with macular edema: Secondary | ICD-10-CM | POA: Diagnosis not present

## 2015-08-19 DIAGNOSIS — H348122 Central retinal vein occlusion, left eye, stable: Secondary | ICD-10-CM | POA: Diagnosis not present

## 2015-09-16 DIAGNOSIS — N183 Chronic kidney disease, stage 3 (moderate): Secondary | ICD-10-CM | POA: Diagnosis not present

## 2015-09-16 DIAGNOSIS — I4891 Unspecified atrial fibrillation: Secondary | ICD-10-CM | POA: Diagnosis not present

## 2015-09-16 DIAGNOSIS — K219 Gastro-esophageal reflux disease without esophagitis: Secondary | ICD-10-CM | POA: Diagnosis not present

## 2015-09-16 DIAGNOSIS — E7800 Pure hypercholesterolemia, unspecified: Secondary | ICD-10-CM | POA: Diagnosis not present

## 2015-09-16 DIAGNOSIS — R911 Solitary pulmonary nodule: Secondary | ICD-10-CM | POA: Diagnosis not present

## 2015-09-16 DIAGNOSIS — M159 Polyosteoarthritis, unspecified: Secondary | ICD-10-CM | POA: Diagnosis not present

## 2015-09-16 DIAGNOSIS — J449 Chronic obstructive pulmonary disease, unspecified: Secondary | ICD-10-CM | POA: Diagnosis not present

## 2015-09-16 DIAGNOSIS — I10 Essential (primary) hypertension: Secondary | ICD-10-CM | POA: Diagnosis not present

## 2015-09-16 DIAGNOSIS — R600 Localized edema: Secondary | ICD-10-CM | POA: Diagnosis not present

## 2015-09-16 DIAGNOSIS — E039 Hypothyroidism, unspecified: Secondary | ICD-10-CM | POA: Diagnosis not present

## 2015-10-07 DIAGNOSIS — N183 Chronic kidney disease, stage 3 (moderate): Secondary | ICD-10-CM | POA: Diagnosis not present

## 2015-10-07 DIAGNOSIS — R600 Localized edema: Secondary | ICD-10-CM | POA: Diagnosis not present

## 2015-10-07 DIAGNOSIS — I4891 Unspecified atrial fibrillation: Secondary | ICD-10-CM | POA: Diagnosis not present

## 2015-10-07 DIAGNOSIS — M109 Gout, unspecified: Secondary | ICD-10-CM | POA: Diagnosis not present

## 2015-10-07 DIAGNOSIS — E7800 Pure hypercholesterolemia, unspecified: Secondary | ICD-10-CM | POA: Diagnosis not present

## 2015-10-07 DIAGNOSIS — M159 Polyosteoarthritis, unspecified: Secondary | ICD-10-CM | POA: Diagnosis not present

## 2015-10-07 DIAGNOSIS — J449 Chronic obstructive pulmonary disease, unspecified: Secondary | ICD-10-CM | POA: Diagnosis not present

## 2015-10-07 DIAGNOSIS — E039 Hypothyroidism, unspecified: Secondary | ICD-10-CM | POA: Diagnosis not present

## 2015-10-07 DIAGNOSIS — I10 Essential (primary) hypertension: Secondary | ICD-10-CM | POA: Diagnosis not present

## 2015-10-07 DIAGNOSIS — R911 Solitary pulmonary nodule: Secondary | ICD-10-CM | POA: Diagnosis not present

## 2015-10-14 DIAGNOSIS — H34811 Central retinal vein occlusion, right eye, with macular edema: Secondary | ICD-10-CM | POA: Diagnosis not present

## 2015-10-15 DIAGNOSIS — K219 Gastro-esophageal reflux disease without esophagitis: Secondary | ICD-10-CM | POA: Diagnosis not present

## 2015-10-15 DIAGNOSIS — R1013 Epigastric pain: Secondary | ICD-10-CM | POA: Diagnosis not present

## 2015-10-20 DIAGNOSIS — I129 Hypertensive chronic kidney disease with stage 1 through stage 4 chronic kidney disease, or unspecified chronic kidney disease: Secondary | ICD-10-CM | POA: Diagnosis not present

## 2015-10-20 DIAGNOSIS — K922 Gastrointestinal hemorrhage, unspecified: Secondary | ICD-10-CM | POA: Diagnosis not present

## 2015-10-20 DIAGNOSIS — K648 Other hemorrhoids: Secondary | ICD-10-CM | POA: Diagnosis not present

## 2015-10-20 DIAGNOSIS — I480 Paroxysmal atrial fibrillation: Secondary | ICD-10-CM | POA: Diagnosis not present

## 2015-10-20 DIAGNOSIS — E7800 Pure hypercholesterolemia, unspecified: Secondary | ICD-10-CM | POA: Diagnosis not present

## 2015-10-20 DIAGNOSIS — K5730 Diverticulosis of large intestine without perforation or abscess without bleeding: Secondary | ICD-10-CM | POA: Diagnosis not present

## 2015-10-20 DIAGNOSIS — E039 Hypothyroidism, unspecified: Secondary | ICD-10-CM | POA: Diagnosis not present

## 2015-10-20 DIAGNOSIS — J449 Chronic obstructive pulmonary disease, unspecified: Secondary | ICD-10-CM | POA: Diagnosis not present

## 2015-10-20 DIAGNOSIS — I4892 Unspecified atrial flutter: Secondary | ICD-10-CM | POA: Diagnosis not present

## 2015-10-20 DIAGNOSIS — N183 Chronic kidney disease, stage 3 (moderate): Secondary | ICD-10-CM | POA: Diagnosis not present

## 2015-10-20 DIAGNOSIS — D649 Anemia, unspecified: Secondary | ICD-10-CM | POA: Diagnosis not present

## 2015-10-20 DIAGNOSIS — E785 Hyperlipidemia, unspecified: Secondary | ICD-10-CM | POA: Diagnosis not present

## 2015-10-20 DIAGNOSIS — D126 Benign neoplasm of colon, unspecified: Secondary | ICD-10-CM | POA: Diagnosis not present

## 2015-10-20 DIAGNOSIS — N189 Chronic kidney disease, unspecified: Secondary | ICD-10-CM

## 2015-10-20 DIAGNOSIS — I10 Essential (primary) hypertension: Secondary | ICD-10-CM

## 2015-10-20 DIAGNOSIS — D62 Acute posthemorrhagic anemia: Secondary | ICD-10-CM | POA: Diagnosis not present

## 2015-10-20 DIAGNOSIS — E870 Hyperosmolality and hypernatremia: Secondary | ICD-10-CM

## 2015-10-20 DIAGNOSIS — D122 Benign neoplasm of ascending colon: Secondary | ICD-10-CM | POA: Diagnosis not present

## 2015-10-20 DIAGNOSIS — K625 Hemorrhage of anus and rectum: Secondary | ICD-10-CM | POA: Diagnosis not present

## 2015-10-20 DIAGNOSIS — K264 Chronic or unspecified duodenal ulcer with hemorrhage: Secondary | ICD-10-CM | POA: Diagnosis not present

## 2015-10-20 DIAGNOSIS — Z7901 Long term (current) use of anticoagulants: Secondary | ICD-10-CM | POA: Diagnosis not present

## 2015-10-20 DIAGNOSIS — K260 Acute duodenal ulcer with hemorrhage: Secondary | ICD-10-CM | POA: Diagnosis not present

## 2015-10-21 DIAGNOSIS — E7800 Pure hypercholesterolemia, unspecified: Secondary | ICD-10-CM

## 2015-10-21 DIAGNOSIS — I4892 Unspecified atrial flutter: Secondary | ICD-10-CM

## 2015-10-21 DIAGNOSIS — I10 Essential (primary) hypertension: Secondary | ICD-10-CM

## 2015-10-21 DIAGNOSIS — K922 Gastrointestinal hemorrhage, unspecified: Secondary | ICD-10-CM

## 2015-10-21 DIAGNOSIS — Z7901 Long term (current) use of anticoagulants: Secondary | ICD-10-CM

## 2015-10-28 DIAGNOSIS — K922 Gastrointestinal hemorrhage, unspecified: Secondary | ICD-10-CM | POA: Diagnosis not present

## 2015-11-02 DIAGNOSIS — I4891 Unspecified atrial fibrillation: Secondary | ICD-10-CM | POA: Diagnosis not present

## 2015-11-02 DIAGNOSIS — N183 Chronic kidney disease, stage 3 (moderate): Secondary | ICD-10-CM | POA: Diagnosis not present

## 2015-11-02 DIAGNOSIS — M159 Polyosteoarthritis, unspecified: Secondary | ICD-10-CM | POA: Diagnosis not present

## 2015-11-02 DIAGNOSIS — I10 Essential (primary) hypertension: Secondary | ICD-10-CM | POA: Diagnosis not present

## 2015-11-02 DIAGNOSIS — J209 Acute bronchitis, unspecified: Secondary | ICD-10-CM | POA: Diagnosis not present

## 2015-11-02 DIAGNOSIS — K922 Gastrointestinal hemorrhage, unspecified: Secondary | ICD-10-CM | POA: Diagnosis not present

## 2015-11-02 DIAGNOSIS — R600 Localized edema: Secondary | ICD-10-CM | POA: Diagnosis not present

## 2015-11-02 DIAGNOSIS — J449 Chronic obstructive pulmonary disease, unspecified: Secondary | ICD-10-CM | POA: Diagnosis not present

## 2015-11-02 DIAGNOSIS — E7800 Pure hypercholesterolemia, unspecified: Secondary | ICD-10-CM | POA: Diagnosis not present

## 2015-11-02 DIAGNOSIS — E039 Hypothyroidism, unspecified: Secondary | ICD-10-CM | POA: Diagnosis not present

## 2015-11-03 DIAGNOSIS — K922 Gastrointestinal hemorrhage, unspecified: Secondary | ICD-10-CM | POA: Diagnosis not present

## 2015-11-10 DIAGNOSIS — Z23 Encounter for immunization: Secondary | ICD-10-CM | POA: Diagnosis not present

## 2015-11-10 DIAGNOSIS — N183 Chronic kidney disease, stage 3 (moderate): Secondary | ICD-10-CM | POA: Diagnosis not present

## 2015-11-10 DIAGNOSIS — J449 Chronic obstructive pulmonary disease, unspecified: Secondary | ICD-10-CM | POA: Diagnosis not present

## 2015-11-10 DIAGNOSIS — E039 Hypothyroidism, unspecified: Secondary | ICD-10-CM | POA: Diagnosis not present

## 2015-11-10 DIAGNOSIS — R600 Localized edema: Secondary | ICD-10-CM | POA: Diagnosis not present

## 2015-11-10 DIAGNOSIS — K922 Gastrointestinal hemorrhage, unspecified: Secondary | ICD-10-CM | POA: Diagnosis not present

## 2015-11-10 DIAGNOSIS — E7800 Pure hypercholesterolemia, unspecified: Secondary | ICD-10-CM | POA: Diagnosis not present

## 2015-11-10 DIAGNOSIS — I10 Essential (primary) hypertension: Secondary | ICD-10-CM | POA: Diagnosis not present

## 2015-11-10 DIAGNOSIS — R911 Solitary pulmonary nodule: Secondary | ICD-10-CM | POA: Diagnosis not present

## 2015-11-10 DIAGNOSIS — M159 Polyosteoarthritis, unspecified: Secondary | ICD-10-CM | POA: Diagnosis not present

## 2015-11-10 DIAGNOSIS — K219 Gastro-esophageal reflux disease without esophagitis: Secondary | ICD-10-CM | POA: Diagnosis not present

## 2015-11-17 DIAGNOSIS — K260 Acute duodenal ulcer with hemorrhage: Secondary | ICD-10-CM | POA: Diagnosis not present

## 2015-11-17 DIAGNOSIS — K5730 Diverticulosis of large intestine without perforation or abscess without bleeding: Secondary | ICD-10-CM | POA: Diagnosis not present

## 2015-11-24 DIAGNOSIS — S50362A Insect bite (nonvenomous) of left elbow, initial encounter: Secondary | ICD-10-CM | POA: Diagnosis not present

## 2015-11-24 DIAGNOSIS — T63441A Toxic effect of venom of bees, accidental (unintentional), initial encounter: Secondary | ICD-10-CM | POA: Diagnosis not present

## 2015-12-06 DIAGNOSIS — H52202 Unspecified astigmatism, left eye: Secondary | ICD-10-CM | POA: Diagnosis not present

## 2015-12-06 DIAGNOSIS — H524 Presbyopia: Secondary | ICD-10-CM | POA: Diagnosis not present

## 2015-12-06 DIAGNOSIS — H5213 Myopia, bilateral: Secondary | ICD-10-CM | POA: Diagnosis not present

## 2015-12-09 DIAGNOSIS — H34811 Central retinal vein occlusion, right eye, with macular edema: Secondary | ICD-10-CM | POA: Diagnosis not present

## 2015-12-22 DIAGNOSIS — M25571 Pain in right ankle and joints of right foot: Secondary | ICD-10-CM | POA: Diagnosis not present

## 2015-12-22 DIAGNOSIS — S161XXA Strain of muscle, fascia and tendon at neck level, initial encounter: Secondary | ICD-10-CM | POA: Diagnosis not present

## 2015-12-22 DIAGNOSIS — D649 Anemia, unspecified: Secondary | ICD-10-CM | POA: Diagnosis not present

## 2015-12-22 DIAGNOSIS — S93401A Sprain of unspecified ligament of right ankle, initial encounter: Secondary | ICD-10-CM | POA: Diagnosis not present

## 2015-12-22 DIAGNOSIS — S79911A Unspecified injury of right hip, initial encounter: Secondary | ICD-10-CM | POA: Diagnosis not present

## 2015-12-22 DIAGNOSIS — S79912A Unspecified injury of left hip, initial encounter: Secondary | ICD-10-CM | POA: Diagnosis not present

## 2015-12-22 DIAGNOSIS — S76011A Strain of muscle, fascia and tendon of right hip, initial encounter: Secondary | ICD-10-CM | POA: Diagnosis not present

## 2015-12-22 DIAGNOSIS — M25552 Pain in left hip: Secondary | ICD-10-CM | POA: Diagnosis not present

## 2015-12-22 DIAGNOSIS — S99911A Unspecified injury of right ankle, initial encounter: Secondary | ICD-10-CM | POA: Diagnosis not present

## 2015-12-22 DIAGNOSIS — W101XXA Fall (on)(from) sidewalk curb, initial encounter: Secondary | ICD-10-CM | POA: Diagnosis not present

## 2015-12-22 DIAGNOSIS — S63642A Sprain of metacarpophalangeal joint of left thumb, initial encounter: Secondary | ICD-10-CM | POA: Diagnosis not present

## 2015-12-22 DIAGNOSIS — S63243A Subluxation of distal interphalangeal joint of left middle finger, initial encounter: Secondary | ICD-10-CM | POA: Diagnosis not present

## 2015-12-22 DIAGNOSIS — S0990XA Unspecified injury of head, initial encounter: Secondary | ICD-10-CM | POA: Diagnosis not present

## 2015-12-22 DIAGNOSIS — M25551 Pain in right hip: Secondary | ICD-10-CM | POA: Diagnosis not present

## 2015-12-22 DIAGNOSIS — S299XXA Unspecified injury of thorax, initial encounter: Secondary | ICD-10-CM | POA: Diagnosis not present

## 2015-12-28 DIAGNOSIS — R600 Localized edema: Secondary | ICD-10-CM | POA: Diagnosis not present

## 2015-12-28 DIAGNOSIS — N183 Chronic kidney disease, stage 3 (moderate): Secondary | ICD-10-CM | POA: Diagnosis not present

## 2015-12-28 DIAGNOSIS — M159 Polyosteoarthritis, unspecified: Secondary | ICD-10-CM | POA: Diagnosis not present

## 2015-12-28 DIAGNOSIS — R911 Solitary pulmonary nodule: Secondary | ICD-10-CM | POA: Diagnosis not present

## 2015-12-28 DIAGNOSIS — K219 Gastro-esophageal reflux disease without esophagitis: Secondary | ICD-10-CM | POA: Diagnosis not present

## 2015-12-28 DIAGNOSIS — E7800 Pure hypercholesterolemia, unspecified: Secondary | ICD-10-CM | POA: Diagnosis not present

## 2015-12-28 DIAGNOSIS — I10 Essential (primary) hypertension: Secondary | ICD-10-CM | POA: Diagnosis not present

## 2015-12-28 DIAGNOSIS — M25551 Pain in right hip: Secondary | ICD-10-CM | POA: Diagnosis not present

## 2015-12-28 DIAGNOSIS — E039 Hypothyroidism, unspecified: Secondary | ICD-10-CM | POA: Diagnosis not present

## 2015-12-28 DIAGNOSIS — J449 Chronic obstructive pulmonary disease, unspecified: Secondary | ICD-10-CM | POA: Diagnosis not present

## 2016-01-03 DIAGNOSIS — M25551 Pain in right hip: Secondary | ICD-10-CM | POA: Diagnosis not present

## 2016-01-03 DIAGNOSIS — M545 Low back pain: Secondary | ICD-10-CM | POA: Diagnosis not present

## 2016-01-07 DIAGNOSIS — M545 Low back pain: Secondary | ICD-10-CM | POA: Diagnosis not present

## 2016-01-07 DIAGNOSIS — M25551 Pain in right hip: Secondary | ICD-10-CM | POA: Diagnosis not present

## 2016-01-11 DIAGNOSIS — M25551 Pain in right hip: Secondary | ICD-10-CM | POA: Diagnosis not present

## 2016-01-11 DIAGNOSIS — M545 Low back pain: Secondary | ICD-10-CM | POA: Diagnosis not present

## 2016-01-14 DIAGNOSIS — M25551 Pain in right hip: Secondary | ICD-10-CM | POA: Diagnosis not present

## 2016-01-14 DIAGNOSIS — M545 Low back pain: Secondary | ICD-10-CM | POA: Diagnosis not present

## 2016-01-17 DIAGNOSIS — M25551 Pain in right hip: Secondary | ICD-10-CM | POA: Diagnosis not present

## 2016-01-17 DIAGNOSIS — M545 Low back pain: Secondary | ICD-10-CM | POA: Diagnosis not present

## 2016-01-18 DIAGNOSIS — K219 Gastro-esophageal reflux disease without esophagitis: Secondary | ICD-10-CM | POA: Diagnosis not present

## 2016-01-18 DIAGNOSIS — M159 Polyosteoarthritis, unspecified: Secondary | ICD-10-CM | POA: Diagnosis not present

## 2016-01-18 DIAGNOSIS — R600 Localized edema: Secondary | ICD-10-CM | POA: Diagnosis not present

## 2016-01-18 DIAGNOSIS — J449 Chronic obstructive pulmonary disease, unspecified: Secondary | ICD-10-CM | POA: Diagnosis not present

## 2016-01-18 DIAGNOSIS — M5417 Radiculopathy, lumbosacral region: Secondary | ICD-10-CM | POA: Diagnosis not present

## 2016-01-18 DIAGNOSIS — E7800 Pure hypercholesterolemia, unspecified: Secondary | ICD-10-CM | POA: Diagnosis not present

## 2016-01-18 DIAGNOSIS — I10 Essential (primary) hypertension: Secondary | ICD-10-CM | POA: Diagnosis not present

## 2016-01-18 DIAGNOSIS — R911 Solitary pulmonary nodule: Secondary | ICD-10-CM | POA: Diagnosis not present

## 2016-01-18 DIAGNOSIS — E039 Hypothyroidism, unspecified: Secondary | ICD-10-CM | POA: Diagnosis not present

## 2016-01-18 DIAGNOSIS — N183 Chronic kidney disease, stage 3 (moderate): Secondary | ICD-10-CM | POA: Diagnosis not present

## 2016-01-25 DIAGNOSIS — M545 Low back pain: Secondary | ICD-10-CM | POA: Diagnosis not present

## 2016-01-25 DIAGNOSIS — M25551 Pain in right hip: Secondary | ICD-10-CM | POA: Diagnosis not present

## 2016-02-02 DIAGNOSIS — M545 Low back pain: Secondary | ICD-10-CM | POA: Diagnosis not present

## 2016-02-02 DIAGNOSIS — M25551 Pain in right hip: Secondary | ICD-10-CM | POA: Diagnosis not present

## 2016-02-03 DIAGNOSIS — H34811 Central retinal vein occlusion, right eye, with macular edema: Secondary | ICD-10-CM | POA: Diagnosis not present

## 2016-02-09 DIAGNOSIS — M25551 Pain in right hip: Secondary | ICD-10-CM | POA: Diagnosis not present

## 2016-02-09 DIAGNOSIS — M545 Low back pain: Secondary | ICD-10-CM | POA: Diagnosis not present

## 2016-02-14 DIAGNOSIS — K219 Gastro-esophageal reflux disease without esophagitis: Secondary | ICD-10-CM | POA: Diagnosis not present

## 2016-02-14 DIAGNOSIS — R600 Localized edema: Secondary | ICD-10-CM | POA: Diagnosis not present

## 2016-02-14 DIAGNOSIS — Z9181 History of falling: Secondary | ICD-10-CM | POA: Diagnosis not present

## 2016-02-14 DIAGNOSIS — R911 Solitary pulmonary nodule: Secondary | ICD-10-CM | POA: Diagnosis not present

## 2016-02-14 DIAGNOSIS — M545 Low back pain: Secondary | ICD-10-CM | POA: Diagnosis not present

## 2016-02-14 DIAGNOSIS — M25551 Pain in right hip: Secondary | ICD-10-CM | POA: Diagnosis not present

## 2016-02-14 DIAGNOSIS — N183 Chronic kidney disease, stage 3 (moderate): Secondary | ICD-10-CM | POA: Diagnosis not present

## 2016-02-14 DIAGNOSIS — M159 Polyosteoarthritis, unspecified: Secondary | ICD-10-CM | POA: Diagnosis not present

## 2016-02-14 DIAGNOSIS — E039 Hypothyroidism, unspecified: Secondary | ICD-10-CM | POA: Diagnosis not present

## 2016-02-14 DIAGNOSIS — I10 Essential (primary) hypertension: Secondary | ICD-10-CM | POA: Diagnosis not present

## 2016-02-14 DIAGNOSIS — J449 Chronic obstructive pulmonary disease, unspecified: Secondary | ICD-10-CM | POA: Diagnosis not present

## 2016-02-14 DIAGNOSIS — E7800 Pure hypercholesterolemia, unspecified: Secondary | ICD-10-CM | POA: Diagnosis not present

## 2016-02-15 DIAGNOSIS — K922 Gastrointestinal hemorrhage, unspecified: Secondary | ICD-10-CM | POA: Diagnosis not present

## 2016-02-28 DIAGNOSIS — J449 Chronic obstructive pulmonary disease, unspecified: Secondary | ICD-10-CM | POA: Diagnosis not present

## 2016-02-28 DIAGNOSIS — K219 Gastro-esophageal reflux disease without esophagitis: Secondary | ICD-10-CM | POA: Diagnosis not present

## 2016-02-28 DIAGNOSIS — R634 Abnormal weight loss: Secondary | ICD-10-CM | POA: Diagnosis not present

## 2016-02-28 DIAGNOSIS — E7800 Pure hypercholesterolemia, unspecified: Secondary | ICD-10-CM | POA: Diagnosis not present

## 2016-02-28 DIAGNOSIS — M545 Low back pain: Secondary | ICD-10-CM | POA: Diagnosis not present

## 2016-02-28 DIAGNOSIS — M25551 Pain in right hip: Secondary | ICD-10-CM | POA: Diagnosis not present

## 2016-02-28 DIAGNOSIS — R911 Solitary pulmonary nodule: Secondary | ICD-10-CM | POA: Diagnosis not present

## 2016-02-28 DIAGNOSIS — R600 Localized edema: Secondary | ICD-10-CM | POA: Diagnosis not present

## 2016-02-28 DIAGNOSIS — E039 Hypothyroidism, unspecified: Secondary | ICD-10-CM | POA: Diagnosis not present

## 2016-02-28 DIAGNOSIS — N183 Chronic kidney disease, stage 3 (moderate): Secondary | ICD-10-CM | POA: Diagnosis not present

## 2016-02-28 DIAGNOSIS — M159 Polyosteoarthritis, unspecified: Secondary | ICD-10-CM | POA: Diagnosis not present

## 2016-02-28 DIAGNOSIS — I10 Essential (primary) hypertension: Secondary | ICD-10-CM | POA: Diagnosis not present

## 2016-03-06 DIAGNOSIS — E039 Hypothyroidism, unspecified: Secondary | ICD-10-CM | POA: Diagnosis not present

## 2016-03-06 DIAGNOSIS — E7800 Pure hypercholesterolemia, unspecified: Secondary | ICD-10-CM | POA: Diagnosis not present

## 2016-03-06 DIAGNOSIS — J449 Chronic obstructive pulmonary disease, unspecified: Secondary | ICD-10-CM | POA: Diagnosis not present

## 2016-03-06 DIAGNOSIS — N183 Chronic kidney disease, stage 3 (moderate): Secondary | ICD-10-CM | POA: Diagnosis not present

## 2016-03-06 DIAGNOSIS — M159 Polyosteoarthritis, unspecified: Secondary | ICD-10-CM | POA: Diagnosis not present

## 2016-03-06 DIAGNOSIS — M25551 Pain in right hip: Secondary | ICD-10-CM | POA: Diagnosis not present

## 2016-03-06 DIAGNOSIS — M109 Gout, unspecified: Secondary | ICD-10-CM | POA: Diagnosis not present

## 2016-03-06 DIAGNOSIS — R911 Solitary pulmonary nodule: Secondary | ICD-10-CM | POA: Diagnosis not present

## 2016-03-06 DIAGNOSIS — M545 Low back pain: Secondary | ICD-10-CM | POA: Diagnosis not present

## 2016-03-06 DIAGNOSIS — I4891 Unspecified atrial fibrillation: Secondary | ICD-10-CM | POA: Diagnosis not present

## 2016-03-06 DIAGNOSIS — K219 Gastro-esophageal reflux disease without esophagitis: Secondary | ICD-10-CM | POA: Diagnosis not present

## 2016-03-06 DIAGNOSIS — I10 Essential (primary) hypertension: Secondary | ICD-10-CM | POA: Diagnosis not present

## 2016-03-07 DIAGNOSIS — R29898 Other symptoms and signs involving the musculoskeletal system: Secondary | ICD-10-CM | POA: Insufficient documentation

## 2016-03-07 DIAGNOSIS — M21371 Foot drop, right foot: Secondary | ICD-10-CM | POA: Insufficient documentation

## 2016-03-07 DIAGNOSIS — M5416 Radiculopathy, lumbar region: Secondary | ICD-10-CM | POA: Insufficient documentation

## 2016-03-07 DIAGNOSIS — Z9181 History of falling: Secondary | ICD-10-CM | POA: Insufficient documentation

## 2016-03-13 DIAGNOSIS — M545 Low back pain: Secondary | ICD-10-CM | POA: Diagnosis not present

## 2016-03-13 DIAGNOSIS — M25551 Pain in right hip: Secondary | ICD-10-CM | POA: Diagnosis not present

## 2016-03-20 DIAGNOSIS — M545 Low back pain: Secondary | ICD-10-CM | POA: Diagnosis not present

## 2016-03-20 DIAGNOSIS — M25551 Pain in right hip: Secondary | ICD-10-CM | POA: Diagnosis not present

## 2016-03-27 DIAGNOSIS — D500 Iron deficiency anemia secondary to blood loss (chronic): Secondary | ICD-10-CM | POA: Diagnosis not present

## 2016-03-27 DIAGNOSIS — D510 Vitamin B12 deficiency anemia due to intrinsic factor deficiency: Secondary | ICD-10-CM | POA: Diagnosis not present

## 2016-03-27 DIAGNOSIS — K260 Acute duodenal ulcer with hemorrhage: Secondary | ICD-10-CM | POA: Diagnosis not present

## 2016-03-28 DIAGNOSIS — M25551 Pain in right hip: Secondary | ICD-10-CM | POA: Diagnosis not present

## 2016-03-28 DIAGNOSIS — M545 Low back pain: Secondary | ICD-10-CM | POA: Diagnosis not present

## 2016-04-04 DIAGNOSIS — R29898 Other symptoms and signs involving the musculoskeletal system: Secondary | ICD-10-CM | POA: Diagnosis not present

## 2016-04-04 DIAGNOSIS — M21371 Foot drop, right foot: Secondary | ICD-10-CM | POA: Diagnosis not present

## 2016-04-04 DIAGNOSIS — M48061 Spinal stenosis, lumbar region without neurogenic claudication: Secondary | ICD-10-CM | POA: Diagnosis not present

## 2016-04-04 DIAGNOSIS — M5416 Radiculopathy, lumbar region: Secondary | ICD-10-CM | POA: Diagnosis not present

## 2016-04-05 DIAGNOSIS — R600 Localized edema: Secondary | ICD-10-CM | POA: Diagnosis not present

## 2016-04-05 DIAGNOSIS — M25551 Pain in right hip: Secondary | ICD-10-CM | POA: Diagnosis not present

## 2016-04-05 DIAGNOSIS — M159 Polyosteoarthritis, unspecified: Secondary | ICD-10-CM | POA: Diagnosis not present

## 2016-04-05 DIAGNOSIS — I4891 Unspecified atrial fibrillation: Secondary | ICD-10-CM | POA: Diagnosis not present

## 2016-04-05 DIAGNOSIS — E7800 Pure hypercholesterolemia, unspecified: Secondary | ICD-10-CM | POA: Diagnosis not present

## 2016-04-05 DIAGNOSIS — M545 Low back pain: Secondary | ICD-10-CM | POA: Diagnosis not present

## 2016-04-05 DIAGNOSIS — J449 Chronic obstructive pulmonary disease, unspecified: Secondary | ICD-10-CM | POA: Diagnosis not present

## 2016-04-05 DIAGNOSIS — E039 Hypothyroidism, unspecified: Secondary | ICD-10-CM | POA: Diagnosis not present

## 2016-04-05 DIAGNOSIS — I10 Essential (primary) hypertension: Secondary | ICD-10-CM | POA: Diagnosis not present

## 2016-04-05 DIAGNOSIS — K219 Gastro-esophageal reflux disease without esophagitis: Secondary | ICD-10-CM | POA: Diagnosis not present

## 2016-04-05 DIAGNOSIS — R911 Solitary pulmonary nodule: Secondary | ICD-10-CM | POA: Diagnosis not present

## 2016-04-05 DIAGNOSIS — N183 Chronic kidney disease, stage 3 (moderate): Secondary | ICD-10-CM | POA: Diagnosis not present

## 2016-04-06 DIAGNOSIS — H34811 Central retinal vein occlusion, right eye, with macular edema: Secondary | ICD-10-CM | POA: Diagnosis not present

## 2016-04-10 DIAGNOSIS — M545 Low back pain: Secondary | ICD-10-CM | POA: Diagnosis not present

## 2016-04-10 DIAGNOSIS — M25551 Pain in right hip: Secondary | ICD-10-CM | POA: Diagnosis not present

## 2016-04-18 DIAGNOSIS — M5416 Radiculopathy, lumbar region: Secondary | ICD-10-CM | POA: Diagnosis not present

## 2016-04-18 DIAGNOSIS — R29898 Other symptoms and signs involving the musculoskeletal system: Secondary | ICD-10-CM | POA: Diagnosis not present

## 2016-04-18 DIAGNOSIS — M21371 Foot drop, right foot: Secondary | ICD-10-CM | POA: Diagnosis not present

## 2016-04-24 DIAGNOSIS — M545 Low back pain: Secondary | ICD-10-CM | POA: Diagnosis not present

## 2016-04-24 DIAGNOSIS — K260 Acute duodenal ulcer with hemorrhage: Secondary | ICD-10-CM | POA: Diagnosis not present

## 2016-04-24 DIAGNOSIS — K591 Functional diarrhea: Secondary | ICD-10-CM | POA: Diagnosis not present

## 2016-04-24 DIAGNOSIS — M25551 Pain in right hip: Secondary | ICD-10-CM | POA: Diagnosis not present

## 2016-04-24 DIAGNOSIS — D126 Benign neoplasm of colon, unspecified: Secondary | ICD-10-CM | POA: Diagnosis not present

## 2016-05-02 DIAGNOSIS — I10 Essential (primary) hypertension: Secondary | ICD-10-CM | POA: Diagnosis not present

## 2016-05-02 DIAGNOSIS — N183 Chronic kidney disease, stage 3 (moderate): Secondary | ICD-10-CM | POA: Diagnosis not present

## 2016-05-02 DIAGNOSIS — I480 Paroxysmal atrial fibrillation: Secondary | ICD-10-CM | POA: Diagnosis not present

## 2016-05-03 DIAGNOSIS — M545 Low back pain: Secondary | ICD-10-CM | POA: Diagnosis not present

## 2016-05-03 DIAGNOSIS — Z1389 Encounter for screening for other disorder: Secondary | ICD-10-CM | POA: Diagnosis not present

## 2016-05-03 DIAGNOSIS — M159 Polyosteoarthritis, unspecified: Secondary | ICD-10-CM | POA: Diagnosis not present

## 2016-05-03 DIAGNOSIS — M25551 Pain in right hip: Secondary | ICD-10-CM | POA: Diagnosis not present

## 2016-05-03 DIAGNOSIS — N183 Chronic kidney disease, stage 3 (moderate): Secondary | ICD-10-CM | POA: Diagnosis not present

## 2016-05-03 DIAGNOSIS — J449 Chronic obstructive pulmonary disease, unspecified: Secondary | ICD-10-CM | POA: Diagnosis not present

## 2016-05-03 DIAGNOSIS — E039 Hypothyroidism, unspecified: Secondary | ICD-10-CM | POA: Diagnosis not present

## 2016-05-03 DIAGNOSIS — I10 Essential (primary) hypertension: Secondary | ICD-10-CM | POA: Diagnosis not present

## 2016-05-03 DIAGNOSIS — I4891 Unspecified atrial fibrillation: Secondary | ICD-10-CM | POA: Diagnosis not present

## 2016-05-03 DIAGNOSIS — E7800 Pure hypercholesterolemia, unspecified: Secondary | ICD-10-CM | POA: Diagnosis not present

## 2016-05-03 DIAGNOSIS — K219 Gastro-esophageal reflux disease without esophagitis: Secondary | ICD-10-CM | POA: Diagnosis not present

## 2016-05-03 DIAGNOSIS — R911 Solitary pulmonary nodule: Secondary | ICD-10-CM | POA: Diagnosis not present

## 2016-05-18 DIAGNOSIS — M25551 Pain in right hip: Secondary | ICD-10-CM | POA: Diagnosis not present

## 2016-05-18 DIAGNOSIS — M545 Low back pain: Secondary | ICD-10-CM | POA: Diagnosis not present

## 2016-05-22 DIAGNOSIS — M21371 Foot drop, right foot: Secondary | ICD-10-CM | POA: Diagnosis not present

## 2016-05-22 DIAGNOSIS — Z5181 Encounter for therapeutic drug level monitoring: Secondary | ICD-10-CM | POA: Diagnosis not present

## 2016-05-22 DIAGNOSIS — M5416 Radiculopathy, lumbar region: Secondary | ICD-10-CM | POA: Diagnosis not present

## 2016-05-22 DIAGNOSIS — R29898 Other symptoms and signs involving the musculoskeletal system: Secondary | ICD-10-CM | POA: Diagnosis not present

## 2016-05-31 DIAGNOSIS — M545 Low back pain: Secondary | ICD-10-CM | POA: Diagnosis not present

## 2016-05-31 DIAGNOSIS — M25551 Pain in right hip: Secondary | ICD-10-CM | POA: Diagnosis not present

## 2016-06-05 DIAGNOSIS — M545 Low back pain: Secondary | ICD-10-CM | POA: Diagnosis not present

## 2016-06-05 DIAGNOSIS — M25551 Pain in right hip: Secondary | ICD-10-CM | POA: Diagnosis not present

## 2016-06-05 DIAGNOSIS — K260 Acute duodenal ulcer with hemorrhage: Secondary | ICD-10-CM | POA: Diagnosis not present

## 2016-06-05 DIAGNOSIS — D126 Benign neoplasm of colon, unspecified: Secondary | ICD-10-CM | POA: Diagnosis not present

## 2016-06-05 DIAGNOSIS — K591 Functional diarrhea: Secondary | ICD-10-CM | POA: Diagnosis not present

## 2016-06-14 DIAGNOSIS — M545 Low back pain: Secondary | ICD-10-CM | POA: Diagnosis not present

## 2016-06-14 DIAGNOSIS — M25551 Pain in right hip: Secondary | ICD-10-CM | POA: Diagnosis not present

## 2016-06-15 DIAGNOSIS — H34811 Central retinal vein occlusion, right eye, with macular edema: Secondary | ICD-10-CM | POA: Diagnosis not present

## 2016-06-20 DIAGNOSIS — M25551 Pain in right hip: Secondary | ICD-10-CM | POA: Diagnosis not present

## 2016-06-20 DIAGNOSIS — M545 Low back pain: Secondary | ICD-10-CM | POA: Diagnosis not present

## 2016-06-29 DIAGNOSIS — M545 Low back pain: Secondary | ICD-10-CM | POA: Diagnosis not present

## 2016-06-29 DIAGNOSIS — M25551 Pain in right hip: Secondary | ICD-10-CM | POA: Diagnosis not present

## 2016-07-06 DIAGNOSIS — K219 Gastro-esophageal reflux disease without esophagitis: Secondary | ICD-10-CM | POA: Diagnosis not present

## 2016-07-06 DIAGNOSIS — M545 Low back pain: Secondary | ICD-10-CM | POA: Diagnosis not present

## 2016-07-06 DIAGNOSIS — M25551 Pain in right hip: Secondary | ICD-10-CM | POA: Diagnosis not present

## 2016-07-06 DIAGNOSIS — M159 Polyosteoarthritis, unspecified: Secondary | ICD-10-CM | POA: Diagnosis not present

## 2016-07-06 DIAGNOSIS — R911 Solitary pulmonary nodule: Secondary | ICD-10-CM | POA: Diagnosis not present

## 2016-07-06 DIAGNOSIS — I4891 Unspecified atrial fibrillation: Secondary | ICD-10-CM | POA: Diagnosis not present

## 2016-07-06 DIAGNOSIS — E7800 Pure hypercholesterolemia, unspecified: Secondary | ICD-10-CM | POA: Diagnosis not present

## 2016-07-06 DIAGNOSIS — J449 Chronic obstructive pulmonary disease, unspecified: Secondary | ICD-10-CM | POA: Diagnosis not present

## 2016-07-06 DIAGNOSIS — I10 Essential (primary) hypertension: Secondary | ICD-10-CM | POA: Diagnosis not present

## 2016-07-06 DIAGNOSIS — M5417 Radiculopathy, lumbosacral region: Secondary | ICD-10-CM | POA: Diagnosis not present

## 2016-07-06 DIAGNOSIS — N183 Chronic kidney disease, stage 3 (moderate): Secondary | ICD-10-CM | POA: Diagnosis not present

## 2016-07-06 DIAGNOSIS — E039 Hypothyroidism, unspecified: Secondary | ICD-10-CM | POA: Diagnosis not present

## 2016-07-12 DIAGNOSIS — M545 Low back pain: Secondary | ICD-10-CM | POA: Diagnosis not present

## 2016-07-12 DIAGNOSIS — M25551 Pain in right hip: Secondary | ICD-10-CM | POA: Diagnosis not present

## 2016-07-20 DIAGNOSIS — R911 Solitary pulmonary nodule: Secondary | ICD-10-CM | POA: Diagnosis not present

## 2016-07-20 DIAGNOSIS — I10 Essential (primary) hypertension: Secondary | ICD-10-CM | POA: Diagnosis not present

## 2016-07-20 DIAGNOSIS — E7800 Pure hypercholesterolemia, unspecified: Secondary | ICD-10-CM | POA: Diagnosis not present

## 2016-07-20 DIAGNOSIS — J449 Chronic obstructive pulmonary disease, unspecified: Secondary | ICD-10-CM | POA: Diagnosis not present

## 2016-07-20 DIAGNOSIS — M545 Low back pain: Secondary | ICD-10-CM | POA: Diagnosis not present

## 2016-07-20 DIAGNOSIS — I4891 Unspecified atrial fibrillation: Secondary | ICD-10-CM | POA: Diagnosis not present

## 2016-07-20 DIAGNOSIS — K219 Gastro-esophageal reflux disease without esophagitis: Secondary | ICD-10-CM | POA: Diagnosis not present

## 2016-07-20 DIAGNOSIS — M5417 Radiculopathy, lumbosacral region: Secondary | ICD-10-CM | POA: Diagnosis not present

## 2016-07-20 DIAGNOSIS — W19XXXA Unspecified fall, initial encounter: Secondary | ICD-10-CM | POA: Diagnosis not present

## 2016-07-20 DIAGNOSIS — S92324A Nondisplaced fracture of second metatarsal bone, right foot, initial encounter for closed fracture: Secondary | ICD-10-CM | POA: Diagnosis not present

## 2016-07-20 DIAGNOSIS — M25551 Pain in right hip: Secondary | ICD-10-CM | POA: Diagnosis not present

## 2016-07-20 DIAGNOSIS — M79671 Pain in right foot: Secondary | ICD-10-CM | POA: Diagnosis not present

## 2016-07-20 DIAGNOSIS — E039 Hypothyroidism, unspecified: Secondary | ICD-10-CM | POA: Diagnosis not present

## 2016-07-20 DIAGNOSIS — N183 Chronic kidney disease, stage 3 (moderate): Secondary | ICD-10-CM | POA: Diagnosis not present

## 2016-07-20 DIAGNOSIS — S92344A Nondisplaced fracture of fourth metatarsal bone, right foot, initial encounter for closed fracture: Secondary | ICD-10-CM | POA: Diagnosis not present

## 2016-07-20 DIAGNOSIS — S92334A Nondisplaced fracture of third metatarsal bone, right foot, initial encounter for closed fracture: Secondary | ICD-10-CM | POA: Diagnosis not present

## 2016-07-24 DIAGNOSIS — I10 Essential (primary) hypertension: Secondary | ICD-10-CM | POA: Diagnosis not present

## 2016-07-24 DIAGNOSIS — R600 Localized edema: Secondary | ICD-10-CM | POA: Diagnosis not present

## 2016-07-24 DIAGNOSIS — K219 Gastro-esophageal reflux disease without esophagitis: Secondary | ICD-10-CM | POA: Diagnosis not present

## 2016-07-24 DIAGNOSIS — I4891 Unspecified atrial fibrillation: Secondary | ICD-10-CM | POA: Diagnosis not present

## 2016-07-24 DIAGNOSIS — E7800 Pure hypercholesterolemia, unspecified: Secondary | ICD-10-CM | POA: Diagnosis not present

## 2016-07-24 DIAGNOSIS — E039 Hypothyroidism, unspecified: Secondary | ICD-10-CM | POA: Diagnosis not present

## 2016-07-24 DIAGNOSIS — J449 Chronic obstructive pulmonary disease, unspecified: Secondary | ICD-10-CM | POA: Diagnosis not present

## 2016-07-24 DIAGNOSIS — R911 Solitary pulmonary nodule: Secondary | ICD-10-CM | POA: Diagnosis not present

## 2016-07-24 DIAGNOSIS — N183 Chronic kidney disease, stage 3 (moderate): Secondary | ICD-10-CM | POA: Diagnosis not present

## 2016-07-24 DIAGNOSIS — S92901A Unspecified fracture of right foot, initial encounter for closed fracture: Secondary | ICD-10-CM | POA: Diagnosis not present

## 2016-07-25 DIAGNOSIS — S92321A Displaced fracture of second metatarsal bone, right foot, initial encounter for closed fracture: Secondary | ICD-10-CM | POA: Diagnosis not present

## 2016-07-25 DIAGNOSIS — S92331A Displaced fracture of third metatarsal bone, right foot, initial encounter for closed fracture: Secondary | ICD-10-CM | POA: Diagnosis not present

## 2016-07-25 DIAGNOSIS — S99922A Unspecified injury of left foot, initial encounter: Secondary | ICD-10-CM | POA: Diagnosis not present

## 2016-07-25 DIAGNOSIS — S92341A Displaced fracture of fourth metatarsal bone, right foot, initial encounter for closed fracture: Secondary | ICD-10-CM | POA: Diagnosis not present

## 2016-07-26 DIAGNOSIS — M5416 Radiculopathy, lumbar region: Secondary | ICD-10-CM | POA: Diagnosis not present

## 2016-07-26 DIAGNOSIS — M21371 Foot drop, right foot: Secondary | ICD-10-CM | POA: Diagnosis not present

## 2016-07-26 DIAGNOSIS — Z9181 History of falling: Secondary | ICD-10-CM | POA: Diagnosis not present

## 2016-07-26 DIAGNOSIS — R29898 Other symptoms and signs involving the musculoskeletal system: Secondary | ICD-10-CM | POA: Diagnosis not present

## 2016-07-28 DIAGNOSIS — S92321D Displaced fracture of second metatarsal bone, right foot, subsequent encounter for fracture with routine healing: Secondary | ICD-10-CM | POA: Diagnosis not present

## 2016-07-28 DIAGNOSIS — H9190 Unspecified hearing loss, unspecified ear: Secondary | ICD-10-CM | POA: Diagnosis not present

## 2016-07-28 DIAGNOSIS — M5417 Radiculopathy, lumbosacral region: Secondary | ICD-10-CM | POA: Diagnosis not present

## 2016-07-28 DIAGNOSIS — J449 Chronic obstructive pulmonary disease, unspecified: Secondary | ICD-10-CM | POA: Diagnosis not present

## 2016-07-28 DIAGNOSIS — M159 Polyosteoarthritis, unspecified: Secondary | ICD-10-CM | POA: Diagnosis not present

## 2016-07-28 DIAGNOSIS — S92331D Displaced fracture of third metatarsal bone, right foot, subsequent encounter for fracture with routine healing: Secondary | ICD-10-CM | POA: Diagnosis not present

## 2016-07-28 DIAGNOSIS — S92341D Displaced fracture of fourth metatarsal bone, right foot, subsequent encounter for fracture with routine healing: Secondary | ICD-10-CM | POA: Diagnosis not present

## 2016-07-28 DIAGNOSIS — I129 Hypertensive chronic kidney disease with stage 1 through stage 4 chronic kidney disease, or unspecified chronic kidney disease: Secondary | ICD-10-CM | POA: Diagnosis not present

## 2016-07-28 DIAGNOSIS — N183 Chronic kidney disease, stage 3 (moderate): Secondary | ICD-10-CM | POA: Diagnosis not present

## 2016-07-28 DIAGNOSIS — I4891 Unspecified atrial fibrillation: Secondary | ICD-10-CM | POA: Diagnosis not present

## 2016-07-31 DIAGNOSIS — M159 Polyosteoarthritis, unspecified: Secondary | ICD-10-CM | POA: Diagnosis not present

## 2016-07-31 DIAGNOSIS — S92321D Displaced fracture of second metatarsal bone, right foot, subsequent encounter for fracture with routine healing: Secondary | ICD-10-CM | POA: Diagnosis not present

## 2016-07-31 DIAGNOSIS — H9190 Unspecified hearing loss, unspecified ear: Secondary | ICD-10-CM | POA: Diagnosis not present

## 2016-07-31 DIAGNOSIS — S92331D Displaced fracture of third metatarsal bone, right foot, subsequent encounter for fracture with routine healing: Secondary | ICD-10-CM | POA: Diagnosis not present

## 2016-07-31 DIAGNOSIS — N183 Chronic kidney disease, stage 3 (moderate): Secondary | ICD-10-CM | POA: Diagnosis not present

## 2016-07-31 DIAGNOSIS — I129 Hypertensive chronic kidney disease with stage 1 through stage 4 chronic kidney disease, or unspecified chronic kidney disease: Secondary | ICD-10-CM | POA: Diagnosis not present

## 2016-07-31 DIAGNOSIS — M5417 Radiculopathy, lumbosacral region: Secondary | ICD-10-CM | POA: Diagnosis not present

## 2016-07-31 DIAGNOSIS — I4891 Unspecified atrial fibrillation: Secondary | ICD-10-CM | POA: Diagnosis not present

## 2016-07-31 DIAGNOSIS — J449 Chronic obstructive pulmonary disease, unspecified: Secondary | ICD-10-CM | POA: Diagnosis not present

## 2016-07-31 DIAGNOSIS — S92341D Displaced fracture of fourth metatarsal bone, right foot, subsequent encounter for fracture with routine healing: Secondary | ICD-10-CM | POA: Diagnosis not present

## 2016-08-01 DIAGNOSIS — N183 Chronic kidney disease, stage 3 (moderate): Secondary | ICD-10-CM | POA: Diagnosis not present

## 2016-08-01 DIAGNOSIS — E7800 Pure hypercholesterolemia, unspecified: Secondary | ICD-10-CM | POA: Diagnosis not present

## 2016-08-01 DIAGNOSIS — I10 Essential (primary) hypertension: Secondary | ICD-10-CM | POA: Diagnosis not present

## 2016-08-01 DIAGNOSIS — K219 Gastro-esophageal reflux disease without esophagitis: Secondary | ICD-10-CM | POA: Diagnosis not present

## 2016-08-01 DIAGNOSIS — R600 Localized edema: Secondary | ICD-10-CM | POA: Diagnosis not present

## 2016-08-01 DIAGNOSIS — J449 Chronic obstructive pulmonary disease, unspecified: Secondary | ICD-10-CM | POA: Diagnosis not present

## 2016-08-01 DIAGNOSIS — E039 Hypothyroidism, unspecified: Secondary | ICD-10-CM | POA: Diagnosis not present

## 2016-08-01 DIAGNOSIS — K922 Gastrointestinal hemorrhage, unspecified: Secondary | ICD-10-CM | POA: Diagnosis not present

## 2016-08-01 DIAGNOSIS — R911 Solitary pulmonary nodule: Secondary | ICD-10-CM | POA: Diagnosis not present

## 2016-08-01 DIAGNOSIS — I4891 Unspecified atrial fibrillation: Secondary | ICD-10-CM | POA: Diagnosis not present

## 2016-08-07 DIAGNOSIS — S92341A Displaced fracture of fourth metatarsal bone, right foot, initial encounter for closed fracture: Secondary | ICD-10-CM | POA: Diagnosis not present

## 2016-08-07 DIAGNOSIS — S99922A Unspecified injury of left foot, initial encounter: Secondary | ICD-10-CM | POA: Diagnosis not present

## 2016-08-07 DIAGNOSIS — S92321A Displaced fracture of second metatarsal bone, right foot, initial encounter for closed fracture: Secondary | ICD-10-CM | POA: Diagnosis not present

## 2016-08-07 DIAGNOSIS — S92331A Displaced fracture of third metatarsal bone, right foot, initial encounter for closed fracture: Secondary | ICD-10-CM | POA: Diagnosis not present

## 2016-08-10 DIAGNOSIS — I129 Hypertensive chronic kidney disease with stage 1 through stage 4 chronic kidney disease, or unspecified chronic kidney disease: Secondary | ICD-10-CM | POA: Diagnosis not present

## 2016-08-10 DIAGNOSIS — S92341D Displaced fracture of fourth metatarsal bone, right foot, subsequent encounter for fracture with routine healing: Secondary | ICD-10-CM | POA: Diagnosis not present

## 2016-08-10 DIAGNOSIS — J449 Chronic obstructive pulmonary disease, unspecified: Secondary | ICD-10-CM | POA: Diagnosis not present

## 2016-08-10 DIAGNOSIS — H9190 Unspecified hearing loss, unspecified ear: Secondary | ICD-10-CM | POA: Diagnosis not present

## 2016-08-10 DIAGNOSIS — M5417 Radiculopathy, lumbosacral region: Secondary | ICD-10-CM | POA: Diagnosis not present

## 2016-08-10 DIAGNOSIS — M5416 Radiculopathy, lumbar region: Secondary | ICD-10-CM | POA: Diagnosis not present

## 2016-08-10 DIAGNOSIS — M159 Polyosteoarthritis, unspecified: Secondary | ICD-10-CM | POA: Diagnosis not present

## 2016-08-10 DIAGNOSIS — S92331D Displaced fracture of third metatarsal bone, right foot, subsequent encounter for fracture with routine healing: Secondary | ICD-10-CM | POA: Diagnosis not present

## 2016-08-10 DIAGNOSIS — I4891 Unspecified atrial fibrillation: Secondary | ICD-10-CM | POA: Diagnosis not present

## 2016-08-10 DIAGNOSIS — N183 Chronic kidney disease, stage 3 (moderate): Secondary | ICD-10-CM | POA: Diagnosis not present

## 2016-08-10 DIAGNOSIS — S92321D Displaced fracture of second metatarsal bone, right foot, subsequent encounter for fracture with routine healing: Secondary | ICD-10-CM | POA: Diagnosis not present

## 2016-08-10 DIAGNOSIS — R29898 Other symptoms and signs involving the musculoskeletal system: Secondary | ICD-10-CM | POA: Diagnosis not present

## 2016-08-10 DIAGNOSIS — M21371 Foot drop, right foot: Secondary | ICD-10-CM | POA: Diagnosis not present

## 2016-08-10 DIAGNOSIS — Z9181 History of falling: Secondary | ICD-10-CM | POA: Diagnosis not present

## 2016-08-14 DIAGNOSIS — N183 Chronic kidney disease, stage 3 (moderate): Secondary | ICD-10-CM | POA: Diagnosis not present

## 2016-08-14 DIAGNOSIS — M5417 Radiculopathy, lumbosacral region: Secondary | ICD-10-CM | POA: Diagnosis not present

## 2016-08-14 DIAGNOSIS — J449 Chronic obstructive pulmonary disease, unspecified: Secondary | ICD-10-CM | POA: Diagnosis not present

## 2016-08-14 DIAGNOSIS — M159 Polyosteoarthritis, unspecified: Secondary | ICD-10-CM | POA: Diagnosis not present

## 2016-08-14 DIAGNOSIS — I129 Hypertensive chronic kidney disease with stage 1 through stage 4 chronic kidney disease, or unspecified chronic kidney disease: Secondary | ICD-10-CM | POA: Diagnosis not present

## 2016-08-14 DIAGNOSIS — I4891 Unspecified atrial fibrillation: Secondary | ICD-10-CM | POA: Diagnosis not present

## 2016-08-14 DIAGNOSIS — H9190 Unspecified hearing loss, unspecified ear: Secondary | ICD-10-CM | POA: Diagnosis not present

## 2016-08-14 DIAGNOSIS — S92341D Displaced fracture of fourth metatarsal bone, right foot, subsequent encounter for fracture with routine healing: Secondary | ICD-10-CM | POA: Diagnosis not present

## 2016-08-14 DIAGNOSIS — S92331D Displaced fracture of third metatarsal bone, right foot, subsequent encounter for fracture with routine healing: Secondary | ICD-10-CM | POA: Diagnosis not present

## 2016-08-14 DIAGNOSIS — S92321D Displaced fracture of second metatarsal bone, right foot, subsequent encounter for fracture with routine healing: Secondary | ICD-10-CM | POA: Diagnosis not present

## 2016-08-15 DIAGNOSIS — M48062 Spinal stenosis, lumbar region with neurogenic claudication: Secondary | ICD-10-CM | POA: Diagnosis not present

## 2016-08-15 DIAGNOSIS — M5431 Sciatica, right side: Secondary | ICD-10-CM | POA: Diagnosis not present

## 2016-08-15 DIAGNOSIS — M5432 Sciatica, left side: Secondary | ICD-10-CM | POA: Diagnosis not present

## 2016-08-16 DIAGNOSIS — J449 Chronic obstructive pulmonary disease, unspecified: Secondary | ICD-10-CM | POA: Diagnosis not present

## 2016-08-16 DIAGNOSIS — I129 Hypertensive chronic kidney disease with stage 1 through stage 4 chronic kidney disease, or unspecified chronic kidney disease: Secondary | ICD-10-CM | POA: Diagnosis not present

## 2016-08-16 DIAGNOSIS — H9190 Unspecified hearing loss, unspecified ear: Secondary | ICD-10-CM | POA: Diagnosis not present

## 2016-08-16 DIAGNOSIS — M5417 Radiculopathy, lumbosacral region: Secondary | ICD-10-CM | POA: Diagnosis not present

## 2016-08-16 DIAGNOSIS — M159 Polyosteoarthritis, unspecified: Secondary | ICD-10-CM | POA: Diagnosis not present

## 2016-08-16 DIAGNOSIS — N183 Chronic kidney disease, stage 3 (moderate): Secondary | ICD-10-CM | POA: Diagnosis not present

## 2016-08-16 DIAGNOSIS — S92331D Displaced fracture of third metatarsal bone, right foot, subsequent encounter for fracture with routine healing: Secondary | ICD-10-CM | POA: Diagnosis not present

## 2016-08-16 DIAGNOSIS — S92341D Displaced fracture of fourth metatarsal bone, right foot, subsequent encounter for fracture with routine healing: Secondary | ICD-10-CM | POA: Diagnosis not present

## 2016-08-16 DIAGNOSIS — I4891 Unspecified atrial fibrillation: Secondary | ICD-10-CM | POA: Diagnosis not present

## 2016-08-16 DIAGNOSIS — S92321D Displaced fracture of second metatarsal bone, right foot, subsequent encounter for fracture with routine healing: Secondary | ICD-10-CM | POA: Diagnosis not present

## 2016-08-18 ENCOUNTER — Other Ambulatory Visit: Payer: Self-pay | Admitting: Orthopedic Surgery

## 2016-08-18 DIAGNOSIS — M47816 Spondylosis without myelopathy or radiculopathy, lumbar region: Secondary | ICD-10-CM

## 2016-08-22 DIAGNOSIS — M159 Polyosteoarthritis, unspecified: Secondary | ICD-10-CM | POA: Diagnosis not present

## 2016-08-22 DIAGNOSIS — N183 Chronic kidney disease, stage 3 (moderate): Secondary | ICD-10-CM | POA: Diagnosis not present

## 2016-08-22 DIAGNOSIS — H9190 Unspecified hearing loss, unspecified ear: Secondary | ICD-10-CM | POA: Diagnosis not present

## 2016-08-22 DIAGNOSIS — S92321D Displaced fracture of second metatarsal bone, right foot, subsequent encounter for fracture with routine healing: Secondary | ICD-10-CM | POA: Diagnosis not present

## 2016-08-22 DIAGNOSIS — S92341D Displaced fracture of fourth metatarsal bone, right foot, subsequent encounter for fracture with routine healing: Secondary | ICD-10-CM | POA: Diagnosis not present

## 2016-08-22 DIAGNOSIS — I4891 Unspecified atrial fibrillation: Secondary | ICD-10-CM | POA: Diagnosis not present

## 2016-08-22 DIAGNOSIS — I129 Hypertensive chronic kidney disease with stage 1 through stage 4 chronic kidney disease, or unspecified chronic kidney disease: Secondary | ICD-10-CM | POA: Diagnosis not present

## 2016-08-22 DIAGNOSIS — S92331D Displaced fracture of third metatarsal bone, right foot, subsequent encounter for fracture with routine healing: Secondary | ICD-10-CM | POA: Diagnosis not present

## 2016-08-22 DIAGNOSIS — J449 Chronic obstructive pulmonary disease, unspecified: Secondary | ICD-10-CM | POA: Diagnosis not present

## 2016-08-22 DIAGNOSIS — M5417 Radiculopathy, lumbosacral region: Secondary | ICD-10-CM | POA: Diagnosis not present

## 2016-08-24 DIAGNOSIS — M159 Polyosteoarthritis, unspecified: Secondary | ICD-10-CM | POA: Diagnosis not present

## 2016-08-24 DIAGNOSIS — M5417 Radiculopathy, lumbosacral region: Secondary | ICD-10-CM | POA: Diagnosis not present

## 2016-08-24 DIAGNOSIS — S92321D Displaced fracture of second metatarsal bone, right foot, subsequent encounter for fracture with routine healing: Secondary | ICD-10-CM | POA: Diagnosis not present

## 2016-08-24 DIAGNOSIS — I4891 Unspecified atrial fibrillation: Secondary | ICD-10-CM | POA: Diagnosis not present

## 2016-08-24 DIAGNOSIS — N183 Chronic kidney disease, stage 3 (moderate): Secondary | ICD-10-CM | POA: Diagnosis not present

## 2016-08-24 DIAGNOSIS — S92331D Displaced fracture of third metatarsal bone, right foot, subsequent encounter for fracture with routine healing: Secondary | ICD-10-CM | POA: Diagnosis not present

## 2016-08-24 DIAGNOSIS — I129 Hypertensive chronic kidney disease with stage 1 through stage 4 chronic kidney disease, or unspecified chronic kidney disease: Secondary | ICD-10-CM | POA: Diagnosis not present

## 2016-08-24 DIAGNOSIS — H9190 Unspecified hearing loss, unspecified ear: Secondary | ICD-10-CM | POA: Diagnosis not present

## 2016-08-24 DIAGNOSIS — S92341D Displaced fracture of fourth metatarsal bone, right foot, subsequent encounter for fracture with routine healing: Secondary | ICD-10-CM | POA: Diagnosis not present

## 2016-08-24 DIAGNOSIS — J449 Chronic obstructive pulmonary disease, unspecified: Secondary | ICD-10-CM | POA: Diagnosis not present

## 2016-08-29 DIAGNOSIS — H9190 Unspecified hearing loss, unspecified ear: Secondary | ICD-10-CM | POA: Diagnosis not present

## 2016-08-29 DIAGNOSIS — S92331D Displaced fracture of third metatarsal bone, right foot, subsequent encounter for fracture with routine healing: Secondary | ICD-10-CM | POA: Diagnosis not present

## 2016-08-29 DIAGNOSIS — S92321D Displaced fracture of second metatarsal bone, right foot, subsequent encounter for fracture with routine healing: Secondary | ICD-10-CM | POA: Diagnosis not present

## 2016-08-29 DIAGNOSIS — M5417 Radiculopathy, lumbosacral region: Secondary | ICD-10-CM | POA: Diagnosis not present

## 2016-08-29 DIAGNOSIS — S92341D Displaced fracture of fourth metatarsal bone, right foot, subsequent encounter for fracture with routine healing: Secondary | ICD-10-CM | POA: Diagnosis not present

## 2016-08-29 DIAGNOSIS — M159 Polyosteoarthritis, unspecified: Secondary | ICD-10-CM | POA: Diagnosis not present

## 2016-08-29 DIAGNOSIS — I129 Hypertensive chronic kidney disease with stage 1 through stage 4 chronic kidney disease, or unspecified chronic kidney disease: Secondary | ICD-10-CM | POA: Diagnosis not present

## 2016-08-29 DIAGNOSIS — I4891 Unspecified atrial fibrillation: Secondary | ICD-10-CM | POA: Diagnosis not present

## 2016-08-29 DIAGNOSIS — N183 Chronic kidney disease, stage 3 (moderate): Secondary | ICD-10-CM | POA: Diagnosis not present

## 2016-08-29 DIAGNOSIS — J449 Chronic obstructive pulmonary disease, unspecified: Secondary | ICD-10-CM | POA: Diagnosis not present

## 2016-08-31 DIAGNOSIS — E7800 Pure hypercholesterolemia, unspecified: Secondary | ICD-10-CM | POA: Diagnosis not present

## 2016-08-31 DIAGNOSIS — M159 Polyosteoarthritis, unspecified: Secondary | ICD-10-CM | POA: Diagnosis not present

## 2016-08-31 DIAGNOSIS — R600 Localized edema: Secondary | ICD-10-CM | POA: Diagnosis not present

## 2016-08-31 DIAGNOSIS — N183 Chronic kidney disease, stage 3 (moderate): Secondary | ICD-10-CM | POA: Diagnosis not present

## 2016-08-31 DIAGNOSIS — S92331D Displaced fracture of third metatarsal bone, right foot, subsequent encounter for fracture with routine healing: Secondary | ICD-10-CM | POA: Diagnosis not present

## 2016-08-31 DIAGNOSIS — I129 Hypertensive chronic kidney disease with stage 1 through stage 4 chronic kidney disease, or unspecified chronic kidney disease: Secondary | ICD-10-CM | POA: Diagnosis not present

## 2016-08-31 DIAGNOSIS — E039 Hypothyroidism, unspecified: Secondary | ICD-10-CM | POA: Diagnosis not present

## 2016-08-31 DIAGNOSIS — K219 Gastro-esophageal reflux disease without esophagitis: Secondary | ICD-10-CM | POA: Diagnosis not present

## 2016-08-31 DIAGNOSIS — S92341D Displaced fracture of fourth metatarsal bone, right foot, subsequent encounter for fracture with routine healing: Secondary | ICD-10-CM | POA: Diagnosis not present

## 2016-08-31 DIAGNOSIS — K922 Gastrointestinal hemorrhage, unspecified: Secondary | ICD-10-CM | POA: Diagnosis not present

## 2016-08-31 DIAGNOSIS — H9190 Unspecified hearing loss, unspecified ear: Secondary | ICD-10-CM | POA: Diagnosis not present

## 2016-08-31 DIAGNOSIS — M5417 Radiculopathy, lumbosacral region: Secondary | ICD-10-CM | POA: Diagnosis not present

## 2016-08-31 DIAGNOSIS — I10 Essential (primary) hypertension: Secondary | ICD-10-CM | POA: Diagnosis not present

## 2016-08-31 DIAGNOSIS — J449 Chronic obstructive pulmonary disease, unspecified: Secondary | ICD-10-CM | POA: Diagnosis not present

## 2016-08-31 DIAGNOSIS — I4891 Unspecified atrial fibrillation: Secondary | ICD-10-CM | POA: Diagnosis not present

## 2016-08-31 DIAGNOSIS — R911 Solitary pulmonary nodule: Secondary | ICD-10-CM | POA: Diagnosis not present

## 2016-08-31 DIAGNOSIS — S92321D Displaced fracture of second metatarsal bone, right foot, subsequent encounter for fracture with routine healing: Secondary | ICD-10-CM | POA: Diagnosis not present

## 2016-09-04 DIAGNOSIS — I4891 Unspecified atrial fibrillation: Secondary | ICD-10-CM | POA: Diagnosis not present

## 2016-09-04 DIAGNOSIS — M159 Polyosteoarthritis, unspecified: Secondary | ICD-10-CM | POA: Diagnosis not present

## 2016-09-04 DIAGNOSIS — I129 Hypertensive chronic kidney disease with stage 1 through stage 4 chronic kidney disease, or unspecified chronic kidney disease: Secondary | ICD-10-CM | POA: Diagnosis not present

## 2016-09-04 DIAGNOSIS — M5417 Radiculopathy, lumbosacral region: Secondary | ICD-10-CM | POA: Diagnosis not present

## 2016-09-04 DIAGNOSIS — S92321D Displaced fracture of second metatarsal bone, right foot, subsequent encounter for fracture with routine healing: Secondary | ICD-10-CM | POA: Diagnosis not present

## 2016-09-04 DIAGNOSIS — J449 Chronic obstructive pulmonary disease, unspecified: Secondary | ICD-10-CM | POA: Diagnosis not present

## 2016-09-04 DIAGNOSIS — H9190 Unspecified hearing loss, unspecified ear: Secondary | ICD-10-CM | POA: Diagnosis not present

## 2016-09-04 DIAGNOSIS — S92341D Displaced fracture of fourth metatarsal bone, right foot, subsequent encounter for fracture with routine healing: Secondary | ICD-10-CM | POA: Diagnosis not present

## 2016-09-04 DIAGNOSIS — S92331D Displaced fracture of third metatarsal bone, right foot, subsequent encounter for fracture with routine healing: Secondary | ICD-10-CM | POA: Diagnosis not present

## 2016-09-04 DIAGNOSIS — N183 Chronic kidney disease, stage 3 (moderate): Secondary | ICD-10-CM | POA: Diagnosis not present

## 2016-09-05 DIAGNOSIS — S92331A Displaced fracture of third metatarsal bone, right foot, initial encounter for closed fracture: Secondary | ICD-10-CM | POA: Diagnosis not present

## 2016-09-05 DIAGNOSIS — M5416 Radiculopathy, lumbar region: Secondary | ICD-10-CM | POA: Diagnosis not present

## 2016-09-05 DIAGNOSIS — S92321A Displaced fracture of second metatarsal bone, right foot, initial encounter for closed fracture: Secondary | ICD-10-CM | POA: Diagnosis not present

## 2016-09-08 DIAGNOSIS — S92341D Displaced fracture of fourth metatarsal bone, right foot, subsequent encounter for fracture with routine healing: Secondary | ICD-10-CM | POA: Diagnosis not present

## 2016-09-08 DIAGNOSIS — S92321D Displaced fracture of second metatarsal bone, right foot, subsequent encounter for fracture with routine healing: Secondary | ICD-10-CM | POA: Diagnosis not present

## 2016-09-08 DIAGNOSIS — S92331D Displaced fracture of third metatarsal bone, right foot, subsequent encounter for fracture with routine healing: Secondary | ICD-10-CM | POA: Diagnosis not present

## 2016-09-08 DIAGNOSIS — M5417 Radiculopathy, lumbosacral region: Secondary | ICD-10-CM | POA: Diagnosis not present

## 2016-09-08 DIAGNOSIS — I4891 Unspecified atrial fibrillation: Secondary | ICD-10-CM | POA: Diagnosis not present

## 2016-09-08 DIAGNOSIS — I129 Hypertensive chronic kidney disease with stage 1 through stage 4 chronic kidney disease, or unspecified chronic kidney disease: Secondary | ICD-10-CM | POA: Diagnosis not present

## 2016-09-08 DIAGNOSIS — J449 Chronic obstructive pulmonary disease, unspecified: Secondary | ICD-10-CM | POA: Diagnosis not present

## 2016-09-08 DIAGNOSIS — H9190 Unspecified hearing loss, unspecified ear: Secondary | ICD-10-CM | POA: Diagnosis not present

## 2016-09-08 DIAGNOSIS — M159 Polyosteoarthritis, unspecified: Secondary | ICD-10-CM | POA: Diagnosis not present

## 2016-09-08 DIAGNOSIS — N183 Chronic kidney disease, stage 3 (moderate): Secondary | ICD-10-CM | POA: Diagnosis not present

## 2016-09-11 DIAGNOSIS — I129 Hypertensive chronic kidney disease with stage 1 through stage 4 chronic kidney disease, or unspecified chronic kidney disease: Secondary | ICD-10-CM | POA: Diagnosis not present

## 2016-09-11 DIAGNOSIS — S92321D Displaced fracture of second metatarsal bone, right foot, subsequent encounter for fracture with routine healing: Secondary | ICD-10-CM | POA: Diagnosis not present

## 2016-09-11 DIAGNOSIS — M159 Polyosteoarthritis, unspecified: Secondary | ICD-10-CM | POA: Diagnosis not present

## 2016-09-11 DIAGNOSIS — I4891 Unspecified atrial fibrillation: Secondary | ICD-10-CM | POA: Diagnosis not present

## 2016-09-11 DIAGNOSIS — N183 Chronic kidney disease, stage 3 (moderate): Secondary | ICD-10-CM | POA: Diagnosis not present

## 2016-09-11 DIAGNOSIS — S92341D Displaced fracture of fourth metatarsal bone, right foot, subsequent encounter for fracture with routine healing: Secondary | ICD-10-CM | POA: Diagnosis not present

## 2016-09-11 DIAGNOSIS — J449 Chronic obstructive pulmonary disease, unspecified: Secondary | ICD-10-CM | POA: Diagnosis not present

## 2016-09-11 DIAGNOSIS — M5417 Radiculopathy, lumbosacral region: Secondary | ICD-10-CM | POA: Diagnosis not present

## 2016-09-11 DIAGNOSIS — H9190 Unspecified hearing loss, unspecified ear: Secondary | ICD-10-CM | POA: Diagnosis not present

## 2016-09-11 DIAGNOSIS — S92331D Displaced fracture of third metatarsal bone, right foot, subsequent encounter for fracture with routine healing: Secondary | ICD-10-CM | POA: Diagnosis not present

## 2016-09-14 ENCOUNTER — Ambulatory Visit
Admission: RE | Admit: 2016-09-14 | Discharge: 2016-09-14 | Disposition: A | Discharge status: Home / Self Care | Payer: Medicare HMO | Source: Ambulatory Visit | Attending: Orthopedic Surgery | Admitting: Orthopedic Surgery

## 2016-09-14 VITALS — BP 91/41 | HR 72

## 2016-09-14 DIAGNOSIS — H43811 Vitreous degeneration, right eye: Secondary | ICD-10-CM | POA: Diagnosis not present

## 2016-09-14 DIAGNOSIS — H35031 Hypertensive retinopathy, right eye: Secondary | ICD-10-CM | POA: Diagnosis not present

## 2016-09-14 DIAGNOSIS — M21371 Foot drop, right foot: Secondary | ICD-10-CM

## 2016-09-14 DIAGNOSIS — H348122 Central retinal vein occlusion, left eye, stable: Secondary | ICD-10-CM | POA: Diagnosis not present

## 2016-09-14 DIAGNOSIS — M47816 Spondylosis without myelopathy or radiculopathy, lumbar region: Secondary | ICD-10-CM

## 2016-09-14 DIAGNOSIS — H348112 Central retinal vein occlusion, right eye, stable: Secondary | ICD-10-CM | POA: Diagnosis not present

## 2016-09-14 DIAGNOSIS — M5416 Radiculopathy, lumbar region: Secondary | ICD-10-CM

## 2016-09-14 DIAGNOSIS — M48061 Spinal stenosis, lumbar region without neurogenic claudication: Secondary | ICD-10-CM | POA: Diagnosis not present

## 2016-09-14 DIAGNOSIS — R29898 Other symptoms and signs involving the musculoskeletal system: Secondary | ICD-10-CM

## 2016-09-14 MED ORDER — IOPAMIDOL (ISOVUE-M 200) INJECTION 41%
10.0000 mL | Freq: Once | INTRAMUSCULAR | Status: AC
  Administered 2016-09-14: 10 mL via INTRATHECAL

## 2016-09-14 MED ORDER — IOPAMIDOL (ISOVUE-M 200) INJECTION 41%
5.0000 mL | Freq: Once | INTRAMUSCULAR | Status: AC
  Administered 2016-09-14: 5 mL via INTRATHECAL

## 2016-09-14 MED ORDER — MEPERIDINE HCL 100 MG/ML IJ SOLN
50.0000 mg | Freq: Once | INTRAMUSCULAR | Status: AC
  Administered 2016-09-14: 50 mg via INTRAMUSCULAR

## 2016-09-14 MED ORDER — ONDANSETRON HCL 4 MG/2ML IJ SOLN
4.0000 mg | Freq: Once | INTRAMUSCULAR | Status: AC
  Administered 2016-09-14: 4 mg via INTRAMUSCULAR

## 2016-09-14 NOTE — Discharge Instructions (Signed)

## 2016-09-19 ENCOUNTER — Other Ambulatory Visit: Payer: Medicare HMO

## 2016-09-19 DIAGNOSIS — M5432 Sciatica, left side: Secondary | ICD-10-CM | POA: Diagnosis not present

## 2016-09-19 DIAGNOSIS — N183 Chronic kidney disease, stage 3 (moderate): Secondary | ICD-10-CM | POA: Diagnosis not present

## 2016-09-19 DIAGNOSIS — I129 Hypertensive chronic kidney disease with stage 1 through stage 4 chronic kidney disease, or unspecified chronic kidney disease: Secondary | ICD-10-CM | POA: Diagnosis not present

## 2016-09-19 DIAGNOSIS — J449 Chronic obstructive pulmonary disease, unspecified: Secondary | ICD-10-CM | POA: Diagnosis not present

## 2016-09-19 DIAGNOSIS — S92321D Displaced fracture of second metatarsal bone, right foot, subsequent encounter for fracture with routine healing: Secondary | ICD-10-CM | POA: Diagnosis not present

## 2016-09-19 DIAGNOSIS — H9190 Unspecified hearing loss, unspecified ear: Secondary | ICD-10-CM | POA: Diagnosis not present

## 2016-09-19 DIAGNOSIS — Z6821 Body mass index (BMI) 21.0-21.9, adult: Secondary | ICD-10-CM | POA: Diagnosis not present

## 2016-09-19 DIAGNOSIS — I4891 Unspecified atrial fibrillation: Secondary | ICD-10-CM | POA: Diagnosis not present

## 2016-09-19 DIAGNOSIS — M5417 Radiculopathy, lumbosacral region: Secondary | ICD-10-CM | POA: Diagnosis not present

## 2016-09-19 DIAGNOSIS — S92331D Displaced fracture of third metatarsal bone, right foot, subsequent encounter for fracture with routine healing: Secondary | ICD-10-CM | POA: Diagnosis not present

## 2016-09-19 DIAGNOSIS — S92341D Displaced fracture of fourth metatarsal bone, right foot, subsequent encounter for fracture with routine healing: Secondary | ICD-10-CM | POA: Diagnosis not present

## 2016-09-19 DIAGNOSIS — M5431 Sciatica, right side: Secondary | ICD-10-CM | POA: Diagnosis not present

## 2016-09-19 DIAGNOSIS — M159 Polyosteoarthritis, unspecified: Secondary | ICD-10-CM | POA: Diagnosis not present

## 2016-09-19 DIAGNOSIS — M48062 Spinal stenosis, lumbar region with neurogenic claudication: Secondary | ICD-10-CM | POA: Diagnosis not present

## 2016-09-20 DIAGNOSIS — S92321D Displaced fracture of second metatarsal bone, right foot, subsequent encounter for fracture with routine healing: Secondary | ICD-10-CM | POA: Diagnosis not present

## 2016-09-20 DIAGNOSIS — M5417 Radiculopathy, lumbosacral region: Secondary | ICD-10-CM | POA: Diagnosis not present

## 2016-09-20 DIAGNOSIS — S92341D Displaced fracture of fourth metatarsal bone, right foot, subsequent encounter for fracture with routine healing: Secondary | ICD-10-CM | POA: Diagnosis not present

## 2016-09-20 DIAGNOSIS — J449 Chronic obstructive pulmonary disease, unspecified: Secondary | ICD-10-CM | POA: Diagnosis not present

## 2016-09-20 DIAGNOSIS — N183 Chronic kidney disease, stage 3 (moderate): Secondary | ICD-10-CM | POA: Diagnosis not present

## 2016-09-20 DIAGNOSIS — H9190 Unspecified hearing loss, unspecified ear: Secondary | ICD-10-CM | POA: Diagnosis not present

## 2016-09-20 DIAGNOSIS — I129 Hypertensive chronic kidney disease with stage 1 through stage 4 chronic kidney disease, or unspecified chronic kidney disease: Secondary | ICD-10-CM | POA: Diagnosis not present

## 2016-09-20 DIAGNOSIS — I4891 Unspecified atrial fibrillation: Secondary | ICD-10-CM | POA: Diagnosis not present

## 2016-09-20 DIAGNOSIS — M159 Polyosteoarthritis, unspecified: Secondary | ICD-10-CM | POA: Diagnosis not present

## 2016-09-20 DIAGNOSIS — S92331D Displaced fracture of third metatarsal bone, right foot, subsequent encounter for fracture with routine healing: Secondary | ICD-10-CM | POA: Diagnosis not present

## 2016-09-25 DIAGNOSIS — Z01810 Encounter for preprocedural cardiovascular examination: Secondary | ICD-10-CM

## 2016-09-25 DIAGNOSIS — I499 Cardiac arrhythmia, unspecified: Secondary | ICD-10-CM | POA: Insufficient documentation

## 2016-09-25 DIAGNOSIS — I480 Paroxysmal atrial fibrillation: Secondary | ICD-10-CM | POA: Diagnosis not present

## 2016-09-25 DIAGNOSIS — Z7901 Long term (current) use of anticoagulants: Secondary | ICD-10-CM | POA: Diagnosis not present

## 2016-09-29 DIAGNOSIS — H9190 Unspecified hearing loss, unspecified ear: Secondary | ICD-10-CM | POA: Diagnosis not present

## 2016-09-29 DIAGNOSIS — I4891 Unspecified atrial fibrillation: Secondary | ICD-10-CM | POA: Diagnosis not present

## 2016-09-29 DIAGNOSIS — I129 Hypertensive chronic kidney disease with stage 1 through stage 4 chronic kidney disease, or unspecified chronic kidney disease: Secondary | ICD-10-CM | POA: Diagnosis not present

## 2016-09-29 DIAGNOSIS — S92321D Displaced fracture of second metatarsal bone, right foot, subsequent encounter for fracture with routine healing: Secondary | ICD-10-CM | POA: Diagnosis not present

## 2016-09-29 DIAGNOSIS — S92331D Displaced fracture of third metatarsal bone, right foot, subsequent encounter for fracture with routine healing: Secondary | ICD-10-CM | POA: Diagnosis not present

## 2016-09-29 DIAGNOSIS — S92341D Displaced fracture of fourth metatarsal bone, right foot, subsequent encounter for fracture with routine healing: Secondary | ICD-10-CM | POA: Diagnosis not present

## 2016-09-29 DIAGNOSIS — M159 Polyosteoarthritis, unspecified: Secondary | ICD-10-CM | POA: Diagnosis not present

## 2016-09-29 DIAGNOSIS — N183 Chronic kidney disease, stage 3 (moderate): Secondary | ICD-10-CM | POA: Diagnosis not present

## 2016-09-29 DIAGNOSIS — M5417 Radiculopathy, lumbosacral region: Secondary | ICD-10-CM | POA: Diagnosis not present

## 2016-09-29 DIAGNOSIS — J449 Chronic obstructive pulmonary disease, unspecified: Secondary | ICD-10-CM | POA: Diagnosis not present

## 2016-10-02 DIAGNOSIS — E7800 Pure hypercholesterolemia, unspecified: Secondary | ICD-10-CM | POA: Diagnosis not present

## 2016-10-02 DIAGNOSIS — N183 Chronic kidney disease, stage 3 (moderate): Secondary | ICD-10-CM | POA: Diagnosis not present

## 2016-10-02 DIAGNOSIS — K922 Gastrointestinal hemorrhage, unspecified: Secondary | ICD-10-CM | POA: Diagnosis not present

## 2016-10-02 DIAGNOSIS — J449 Chronic obstructive pulmonary disease, unspecified: Secondary | ICD-10-CM | POA: Diagnosis not present

## 2016-10-02 DIAGNOSIS — I10 Essential (primary) hypertension: Secondary | ICD-10-CM | POA: Diagnosis not present

## 2016-10-02 DIAGNOSIS — I4891 Unspecified atrial fibrillation: Secondary | ICD-10-CM | POA: Diagnosis not present

## 2016-10-02 DIAGNOSIS — K219 Gastro-esophageal reflux disease without esophagitis: Secondary | ICD-10-CM | POA: Diagnosis not present

## 2016-10-02 DIAGNOSIS — R911 Solitary pulmonary nodule: Secondary | ICD-10-CM | POA: Diagnosis not present

## 2016-10-02 DIAGNOSIS — R600 Localized edema: Secondary | ICD-10-CM | POA: Diagnosis not present

## 2016-10-02 DIAGNOSIS — E039 Hypothyroidism, unspecified: Secondary | ICD-10-CM | POA: Diagnosis not present

## 2016-10-02 DIAGNOSIS — M109 Gout, unspecified: Secondary | ICD-10-CM | POA: Diagnosis not present

## 2016-10-02 DIAGNOSIS — Z01810 Encounter for preprocedural cardiovascular examination: Secondary | ICD-10-CM | POA: Diagnosis not present

## 2016-10-03 DIAGNOSIS — N183 Chronic kidney disease, stage 3 (moderate): Secondary | ICD-10-CM | POA: Diagnosis not present

## 2016-10-03 DIAGNOSIS — M5417 Radiculopathy, lumbosacral region: Secondary | ICD-10-CM | POA: Diagnosis not present

## 2016-10-03 DIAGNOSIS — S92341D Displaced fracture of fourth metatarsal bone, right foot, subsequent encounter for fracture with routine healing: Secondary | ICD-10-CM | POA: Diagnosis not present

## 2016-10-03 DIAGNOSIS — I129 Hypertensive chronic kidney disease with stage 1 through stage 4 chronic kidney disease, or unspecified chronic kidney disease: Secondary | ICD-10-CM | POA: Diagnosis not present

## 2016-10-03 DIAGNOSIS — J449 Chronic obstructive pulmonary disease, unspecified: Secondary | ICD-10-CM | POA: Diagnosis not present

## 2016-10-03 DIAGNOSIS — S92321D Displaced fracture of second metatarsal bone, right foot, subsequent encounter for fracture with routine healing: Secondary | ICD-10-CM | POA: Diagnosis not present

## 2016-10-03 DIAGNOSIS — S92331D Displaced fracture of third metatarsal bone, right foot, subsequent encounter for fracture with routine healing: Secondary | ICD-10-CM | POA: Diagnosis not present

## 2016-10-03 DIAGNOSIS — H9190 Unspecified hearing loss, unspecified ear: Secondary | ICD-10-CM | POA: Diagnosis not present

## 2016-10-03 DIAGNOSIS — M159 Polyosteoarthritis, unspecified: Secondary | ICD-10-CM | POA: Diagnosis not present

## 2016-10-03 DIAGNOSIS — I4891 Unspecified atrial fibrillation: Secondary | ICD-10-CM | POA: Diagnosis not present

## 2016-10-10 DIAGNOSIS — M48062 Spinal stenosis, lumbar region with neurogenic claudication: Secondary | ICD-10-CM | POA: Diagnosis not present

## 2016-10-11 DIAGNOSIS — M159 Polyosteoarthritis, unspecified: Secondary | ICD-10-CM | POA: Diagnosis not present

## 2016-10-11 DIAGNOSIS — J449 Chronic obstructive pulmonary disease, unspecified: Secondary | ICD-10-CM | POA: Diagnosis not present

## 2016-10-11 DIAGNOSIS — H9190 Unspecified hearing loss, unspecified ear: Secondary | ICD-10-CM | POA: Diagnosis not present

## 2016-10-11 DIAGNOSIS — N183 Chronic kidney disease, stage 3 (moderate): Secondary | ICD-10-CM | POA: Diagnosis not present

## 2016-10-11 DIAGNOSIS — S92331D Displaced fracture of third metatarsal bone, right foot, subsequent encounter for fracture with routine healing: Secondary | ICD-10-CM | POA: Diagnosis not present

## 2016-10-11 DIAGNOSIS — S92321D Displaced fracture of second metatarsal bone, right foot, subsequent encounter for fracture with routine healing: Secondary | ICD-10-CM | POA: Diagnosis not present

## 2016-10-11 DIAGNOSIS — I4891 Unspecified atrial fibrillation: Secondary | ICD-10-CM | POA: Diagnosis not present

## 2016-10-11 DIAGNOSIS — I129 Hypertensive chronic kidney disease with stage 1 through stage 4 chronic kidney disease, or unspecified chronic kidney disease: Secondary | ICD-10-CM | POA: Diagnosis not present

## 2016-10-11 DIAGNOSIS — M5417 Radiculopathy, lumbosacral region: Secondary | ICD-10-CM | POA: Diagnosis not present

## 2016-10-11 DIAGNOSIS — S92341D Displaced fracture of fourth metatarsal bone, right foot, subsequent encounter for fracture with routine healing: Secondary | ICD-10-CM | POA: Diagnosis not present

## 2016-10-13 DIAGNOSIS — Z4689 Encounter for fitting and adjustment of other specified devices: Secondary | ICD-10-CM | POA: Diagnosis not present

## 2016-10-13 DIAGNOSIS — M5431 Sciatica, right side: Secondary | ICD-10-CM | POA: Diagnosis not present

## 2016-10-13 DIAGNOSIS — M48062 Spinal stenosis, lumbar region with neurogenic claudication: Secondary | ICD-10-CM | POA: Diagnosis not present

## 2016-10-13 DIAGNOSIS — M5432 Sciatica, left side: Secondary | ICD-10-CM | POA: Diagnosis not present

## 2016-10-13 DIAGNOSIS — M545 Low back pain: Secondary | ICD-10-CM | POA: Diagnosis not present

## 2016-10-13 DIAGNOSIS — M4726 Other spondylosis with radiculopathy, lumbar region: Secondary | ICD-10-CM | POA: Diagnosis not present

## 2016-10-16 DIAGNOSIS — M21371 Foot drop, right foot: Secondary | ICD-10-CM | POA: Diagnosis not present

## 2016-10-16 DIAGNOSIS — M21372 Foot drop, left foot: Secondary | ICD-10-CM | POA: Diagnosis not present

## 2016-10-16 DIAGNOSIS — M48062 Spinal stenosis, lumbar region with neurogenic claudication: Secondary | ICD-10-CM | POA: Diagnosis not present

## 2016-10-17 DIAGNOSIS — I4891 Unspecified atrial fibrillation: Secondary | ICD-10-CM | POA: Diagnosis not present

## 2016-10-17 DIAGNOSIS — M159 Polyosteoarthritis, unspecified: Secondary | ICD-10-CM | POA: Diagnosis not present

## 2016-10-17 DIAGNOSIS — S92341D Displaced fracture of fourth metatarsal bone, right foot, subsequent encounter for fracture with routine healing: Secondary | ICD-10-CM | POA: Diagnosis not present

## 2016-10-17 DIAGNOSIS — S92321D Displaced fracture of second metatarsal bone, right foot, subsequent encounter for fracture with routine healing: Secondary | ICD-10-CM | POA: Diagnosis not present

## 2016-10-17 DIAGNOSIS — J449 Chronic obstructive pulmonary disease, unspecified: Secondary | ICD-10-CM | POA: Diagnosis not present

## 2016-10-17 DIAGNOSIS — I129 Hypertensive chronic kidney disease with stage 1 through stage 4 chronic kidney disease, or unspecified chronic kidney disease: Secondary | ICD-10-CM | POA: Diagnosis not present

## 2016-10-17 DIAGNOSIS — H9190 Unspecified hearing loss, unspecified ear: Secondary | ICD-10-CM | POA: Diagnosis not present

## 2016-10-17 DIAGNOSIS — S92331D Displaced fracture of third metatarsal bone, right foot, subsequent encounter for fracture with routine healing: Secondary | ICD-10-CM | POA: Diagnosis not present

## 2016-10-17 DIAGNOSIS — M5417 Radiculopathy, lumbosacral region: Secondary | ICD-10-CM | POA: Diagnosis not present

## 2016-10-17 DIAGNOSIS — N183 Chronic kidney disease, stage 3 (moderate): Secondary | ICD-10-CM | POA: Diagnosis not present

## 2016-10-18 DIAGNOSIS — E039 Hypothyroidism, unspecified: Secondary | ICD-10-CM | POA: Diagnosis not present

## 2016-10-18 DIAGNOSIS — I480 Paroxysmal atrial fibrillation: Secondary | ICD-10-CM | POA: Diagnosis not present

## 2016-10-18 DIAGNOSIS — Z7901 Long term (current) use of anticoagulants: Secondary | ICD-10-CM | POA: Diagnosis not present

## 2016-10-18 DIAGNOSIS — M48062 Spinal stenosis, lumbar region with neurogenic claudication: Secondary | ICD-10-CM | POA: Diagnosis not present

## 2016-10-18 DIAGNOSIS — Z01818 Encounter for other preprocedural examination: Secondary | ICD-10-CM | POA: Diagnosis not present

## 2016-10-23 DIAGNOSIS — M5136 Other intervertebral disc degeneration, lumbar region: Secondary | ICD-10-CM | POA: Diagnosis not present

## 2016-10-23 DIAGNOSIS — M4726 Other spondylosis with radiculopathy, lumbar region: Secondary | ICD-10-CM | POA: Diagnosis not present

## 2016-10-23 DIAGNOSIS — M48062 Spinal stenosis, lumbar region with neurogenic claudication: Secondary | ICD-10-CM | POA: Diagnosis not present

## 2016-10-25 DIAGNOSIS — K5900 Constipation, unspecified: Secondary | ICD-10-CM | POA: Diagnosis not present

## 2016-10-25 DIAGNOSIS — M4726 Other spondylosis with radiculopathy, lumbar region: Secondary | ICD-10-CM | POA: Diagnosis not present

## 2016-10-25 DIAGNOSIS — Z4782 Encounter for orthopedic aftercare following scoliosis surgery: Secondary | ICD-10-CM | POA: Diagnosis not present

## 2016-10-25 DIAGNOSIS — M549 Dorsalgia, unspecified: Secondary | ICD-10-CM | POA: Diagnosis not present

## 2016-10-25 DIAGNOSIS — I482 Chronic atrial fibrillation: Secondary | ICD-10-CM | POA: Diagnosis not present

## 2016-10-25 DIAGNOSIS — M4316 Spondylolisthesis, lumbar region: Secondary | ICD-10-CM | POA: Diagnosis not present

## 2016-10-25 DIAGNOSIS — N390 Urinary tract infection, site not specified: Secondary | ICD-10-CM | POA: Diagnosis not present

## 2016-10-25 DIAGNOSIS — M48062 Spinal stenosis, lumbar region with neurogenic claudication: Secondary | ICD-10-CM | POA: Diagnosis not present

## 2016-10-25 DIAGNOSIS — M545 Low back pain: Secondary | ICD-10-CM | POA: Diagnosis not present

## 2016-10-25 DIAGNOSIS — E039 Hypothyroidism, unspecified: Secondary | ICD-10-CM | POA: Diagnosis not present

## 2016-10-25 DIAGNOSIS — K219 Gastro-esophageal reflux disease without esophagitis: Secondary | ICD-10-CM | POA: Diagnosis not present

## 2016-10-27 DIAGNOSIS — E039 Hypothyroidism, unspecified: Secondary | ICD-10-CM | POA: Diagnosis not present

## 2016-10-27 DIAGNOSIS — I482 Chronic atrial fibrillation: Secondary | ICD-10-CM | POA: Diagnosis not present

## 2016-10-27 DIAGNOSIS — K219 Gastro-esophageal reflux disease without esophagitis: Secondary | ICD-10-CM | POA: Diagnosis not present

## 2016-10-27 DIAGNOSIS — Z4889 Encounter for other specified surgical aftercare: Secondary | ICD-10-CM | POA: Diagnosis not present

## 2016-10-27 DIAGNOSIS — M21371 Foot drop, right foot: Secondary | ICD-10-CM | POA: Diagnosis not present

## 2016-10-27 DIAGNOSIS — K611 Rectal abscess: Secondary | ICD-10-CM | POA: Diagnosis not present

## 2016-10-27 DIAGNOSIS — M545 Low back pain: Secondary | ICD-10-CM | POA: Diagnosis not present

## 2016-10-27 DIAGNOSIS — Z4782 Encounter for orthopedic aftercare following scoliosis surgery: Secondary | ICD-10-CM | POA: Diagnosis not present

## 2016-10-27 DIAGNOSIS — M21372 Foot drop, left foot: Secondary | ICD-10-CM | POA: Diagnosis not present

## 2016-10-27 DIAGNOSIS — I4891 Unspecified atrial fibrillation: Secondary | ICD-10-CM | POA: Diagnosis not present

## 2016-10-27 DIAGNOSIS — M48062 Spinal stenosis, lumbar region with neurogenic claudication: Secondary | ICD-10-CM | POA: Diagnosis not present

## 2016-10-27 DIAGNOSIS — K5900 Constipation, unspecified: Secondary | ICD-10-CM | POA: Diagnosis not present

## 2016-10-27 DIAGNOSIS — G8918 Other acute postprocedural pain: Secondary | ICD-10-CM | POA: Diagnosis not present

## 2016-10-27 DIAGNOSIS — R262 Difficulty in walking, not elsewhere classified: Secondary | ICD-10-CM | POA: Diagnosis not present

## 2016-10-27 DIAGNOSIS — M549 Dorsalgia, unspecified: Secondary | ICD-10-CM | POA: Diagnosis not present

## 2016-10-28 DIAGNOSIS — R262 Difficulty in walking, not elsewhere classified: Secondary | ICD-10-CM | POA: Diagnosis not present

## 2016-10-28 DIAGNOSIS — Z4889 Encounter for other specified surgical aftercare: Secondary | ICD-10-CM | POA: Diagnosis not present

## 2016-10-28 DIAGNOSIS — I4891 Unspecified atrial fibrillation: Secondary | ICD-10-CM | POA: Diagnosis not present

## 2016-10-28 DIAGNOSIS — G8918 Other acute postprocedural pain: Secondary | ICD-10-CM | POA: Diagnosis not present

## 2016-11-20 DIAGNOSIS — M21372 Foot drop, left foot: Secondary | ICD-10-CM | POA: Diagnosis not present

## 2016-11-20 DIAGNOSIS — M21371 Foot drop, right foot: Secondary | ICD-10-CM | POA: Diagnosis not present

## 2016-11-20 DIAGNOSIS — I4891 Unspecified atrial fibrillation: Secondary | ICD-10-CM | POA: Diagnosis not present

## 2016-11-20 DIAGNOSIS — K611 Rectal abscess: Secondary | ICD-10-CM | POA: Diagnosis not present

## 2016-11-20 DIAGNOSIS — K219 Gastro-esophageal reflux disease without esophagitis: Secondary | ICD-10-CM | POA: Diagnosis not present

## 2016-11-21 DIAGNOSIS — M48062 Spinal stenosis, lumbar region with neurogenic claudication: Secondary | ICD-10-CM | POA: Diagnosis not present

## 2016-12-04 DIAGNOSIS — R262 Difficulty in walking, not elsewhere classified: Secondary | ICD-10-CM | POA: Diagnosis not present

## 2016-12-04 DIAGNOSIS — M25571 Pain in right ankle and joints of right foot: Secondary | ICD-10-CM | POA: Diagnosis not present

## 2016-12-04 DIAGNOSIS — M2550 Pain in unspecified joint: Secondary | ICD-10-CM | POA: Diagnosis not present

## 2016-12-04 DIAGNOSIS — E785 Hyperlipidemia, unspecified: Secondary | ICD-10-CM | POA: Diagnosis not present

## 2016-12-07 DIAGNOSIS — R269 Unspecified abnormalities of gait and mobility: Secondary | ICD-10-CM | POA: Diagnosis not present

## 2016-12-07 DIAGNOSIS — M6281 Muscle weakness (generalized): Secondary | ICD-10-CM | POA: Diagnosis not present

## 2016-12-09 DIAGNOSIS — K219 Gastro-esophageal reflux disease without esophagitis: Secondary | ICD-10-CM | POA: Diagnosis not present

## 2016-12-09 DIAGNOSIS — M48062 Spinal stenosis, lumbar region with neurogenic claudication: Secondary | ICD-10-CM | POA: Diagnosis not present

## 2016-12-09 DIAGNOSIS — G8918 Other acute postprocedural pain: Secondary | ICD-10-CM | POA: Diagnosis not present

## 2016-12-09 DIAGNOSIS — M6281 Muscle weakness (generalized): Secondary | ICD-10-CM | POA: Diagnosis not present

## 2016-12-09 DIAGNOSIS — M810 Age-related osteoporosis without current pathological fracture: Secondary | ICD-10-CM | POA: Diagnosis not present

## 2016-12-09 DIAGNOSIS — I4891 Unspecified atrial fibrillation: Secondary | ICD-10-CM | POA: Diagnosis not present

## 2016-12-11 DIAGNOSIS — K219 Gastro-esophageal reflux disease without esophagitis: Secondary | ICD-10-CM | POA: Diagnosis not present

## 2016-12-11 DIAGNOSIS — I4891 Unspecified atrial fibrillation: Secondary | ICD-10-CM | POA: Diagnosis not present

## 2016-12-11 DIAGNOSIS — M810 Age-related osteoporosis without current pathological fracture: Secondary | ICD-10-CM | POA: Diagnosis not present

## 2016-12-11 DIAGNOSIS — G8918 Other acute postprocedural pain: Secondary | ICD-10-CM | POA: Diagnosis not present

## 2016-12-11 DIAGNOSIS — M48062 Spinal stenosis, lumbar region with neurogenic claudication: Secondary | ICD-10-CM | POA: Diagnosis not present

## 2016-12-11 DIAGNOSIS — M6281 Muscle weakness (generalized): Secondary | ICD-10-CM | POA: Diagnosis not present

## 2016-12-12 DIAGNOSIS — M6281 Muscle weakness (generalized): Secondary | ICD-10-CM | POA: Diagnosis not present

## 2016-12-12 DIAGNOSIS — K219 Gastro-esophageal reflux disease without esophagitis: Secondary | ICD-10-CM | POA: Diagnosis not present

## 2016-12-12 DIAGNOSIS — M48062 Spinal stenosis, lumbar region with neurogenic claudication: Secondary | ICD-10-CM | POA: Diagnosis not present

## 2016-12-12 DIAGNOSIS — G8918 Other acute postprocedural pain: Secondary | ICD-10-CM | POA: Diagnosis not present

## 2016-12-12 DIAGNOSIS — I4891 Unspecified atrial fibrillation: Secondary | ICD-10-CM | POA: Diagnosis not present

## 2016-12-12 DIAGNOSIS — M810 Age-related osteoporosis without current pathological fracture: Secondary | ICD-10-CM | POA: Diagnosis not present

## 2016-12-14 DIAGNOSIS — Z23 Encounter for immunization: Secondary | ICD-10-CM | POA: Diagnosis not present

## 2016-12-14 DIAGNOSIS — R600 Localized edema: Secondary | ICD-10-CM | POA: Diagnosis not present

## 2016-12-14 DIAGNOSIS — M810 Age-related osteoporosis without current pathological fracture: Secondary | ICD-10-CM | POA: Diagnosis not present

## 2016-12-14 DIAGNOSIS — M48062 Spinal stenosis, lumbar region with neurogenic claudication: Secondary | ICD-10-CM | POA: Diagnosis not present

## 2016-12-14 DIAGNOSIS — N183 Chronic kidney disease, stage 3 (moderate): Secondary | ICD-10-CM | POA: Diagnosis not present

## 2016-12-14 DIAGNOSIS — J449 Chronic obstructive pulmonary disease, unspecified: Secondary | ICD-10-CM | POA: Diagnosis not present

## 2016-12-14 DIAGNOSIS — J309 Allergic rhinitis, unspecified: Secondary | ICD-10-CM | POA: Diagnosis not present

## 2016-12-14 DIAGNOSIS — E039 Hypothyroidism, unspecified: Secondary | ICD-10-CM | POA: Diagnosis not present

## 2016-12-14 DIAGNOSIS — I10 Essential (primary) hypertension: Secondary | ICD-10-CM | POA: Diagnosis not present

## 2016-12-14 DIAGNOSIS — I4891 Unspecified atrial fibrillation: Secondary | ICD-10-CM | POA: Diagnosis not present

## 2016-12-14 DIAGNOSIS — K219 Gastro-esophageal reflux disease without esophagitis: Secondary | ICD-10-CM | POA: Diagnosis not present

## 2016-12-14 DIAGNOSIS — G8918 Other acute postprocedural pain: Secondary | ICD-10-CM | POA: Diagnosis not present

## 2016-12-14 DIAGNOSIS — M6281 Muscle weakness (generalized): Secondary | ICD-10-CM | POA: Diagnosis not present

## 2016-12-14 DIAGNOSIS — E7800 Pure hypercholesterolemia, unspecified: Secondary | ICD-10-CM | POA: Diagnosis not present

## 2016-12-14 DIAGNOSIS — M5417 Radiculopathy, lumbosacral region: Secondary | ICD-10-CM | POA: Diagnosis not present

## 2016-12-15 DIAGNOSIS — G8918 Other acute postprocedural pain: Secondary | ICD-10-CM | POA: Diagnosis not present

## 2016-12-15 DIAGNOSIS — M6281 Muscle weakness (generalized): Secondary | ICD-10-CM | POA: Diagnosis not present

## 2016-12-15 DIAGNOSIS — R262 Difficulty in walking, not elsewhere classified: Secondary | ICD-10-CM | POA: Diagnosis not present

## 2016-12-15 DIAGNOSIS — I4891 Unspecified atrial fibrillation: Secondary | ICD-10-CM | POA: Diagnosis not present

## 2016-12-15 DIAGNOSIS — K219 Gastro-esophageal reflux disease without esophagitis: Secondary | ICD-10-CM | POA: Diagnosis not present

## 2016-12-15 DIAGNOSIS — M48062 Spinal stenosis, lumbar region with neurogenic claudication: Secondary | ICD-10-CM | POA: Diagnosis not present

## 2016-12-15 DIAGNOSIS — M810 Age-related osteoporosis without current pathological fracture: Secondary | ICD-10-CM | POA: Diagnosis not present

## 2016-12-18 DIAGNOSIS — G8918 Other acute postprocedural pain: Secondary | ICD-10-CM | POA: Diagnosis not present

## 2016-12-18 DIAGNOSIS — K219 Gastro-esophageal reflux disease without esophagitis: Secondary | ICD-10-CM | POA: Diagnosis not present

## 2016-12-18 DIAGNOSIS — M810 Age-related osteoporosis without current pathological fracture: Secondary | ICD-10-CM | POA: Diagnosis not present

## 2016-12-18 DIAGNOSIS — M48062 Spinal stenosis, lumbar region with neurogenic claudication: Secondary | ICD-10-CM | POA: Diagnosis not present

## 2016-12-18 DIAGNOSIS — M6281 Muscle weakness (generalized): Secondary | ICD-10-CM | POA: Diagnosis not present

## 2016-12-18 DIAGNOSIS — I4891 Unspecified atrial fibrillation: Secondary | ICD-10-CM | POA: Diagnosis not present

## 2016-12-19 DIAGNOSIS — M48062 Spinal stenosis, lumbar region with neurogenic claudication: Secondary | ICD-10-CM | POA: Diagnosis not present

## 2016-12-19 DIAGNOSIS — I4891 Unspecified atrial fibrillation: Secondary | ICD-10-CM | POA: Diagnosis not present

## 2016-12-19 DIAGNOSIS — M810 Age-related osteoporosis without current pathological fracture: Secondary | ICD-10-CM | POA: Diagnosis not present

## 2016-12-19 DIAGNOSIS — K219 Gastro-esophageal reflux disease without esophagitis: Secondary | ICD-10-CM | POA: Diagnosis not present

## 2016-12-19 DIAGNOSIS — G8918 Other acute postprocedural pain: Secondary | ICD-10-CM | POA: Diagnosis not present

## 2016-12-19 DIAGNOSIS — M6281 Muscle weakness (generalized): Secondary | ICD-10-CM | POA: Diagnosis not present

## 2016-12-20 DIAGNOSIS — I4891 Unspecified atrial fibrillation: Secondary | ICD-10-CM | POA: Diagnosis not present

## 2016-12-20 DIAGNOSIS — K219 Gastro-esophageal reflux disease without esophagitis: Secondary | ICD-10-CM | POA: Diagnosis not present

## 2016-12-20 DIAGNOSIS — M6281 Muscle weakness (generalized): Secondary | ICD-10-CM | POA: Diagnosis not present

## 2016-12-20 DIAGNOSIS — M48062 Spinal stenosis, lumbar region with neurogenic claudication: Secondary | ICD-10-CM | POA: Diagnosis not present

## 2016-12-20 DIAGNOSIS — G8918 Other acute postprocedural pain: Secondary | ICD-10-CM | POA: Diagnosis not present

## 2016-12-20 DIAGNOSIS — M810 Age-related osteoporosis without current pathological fracture: Secondary | ICD-10-CM | POA: Diagnosis not present

## 2016-12-22 DIAGNOSIS — M810 Age-related osteoporosis without current pathological fracture: Secondary | ICD-10-CM | POA: Diagnosis not present

## 2016-12-22 DIAGNOSIS — K219 Gastro-esophageal reflux disease without esophagitis: Secondary | ICD-10-CM | POA: Diagnosis not present

## 2016-12-22 DIAGNOSIS — M48062 Spinal stenosis, lumbar region with neurogenic claudication: Secondary | ICD-10-CM | POA: Diagnosis not present

## 2016-12-22 DIAGNOSIS — G8918 Other acute postprocedural pain: Secondary | ICD-10-CM | POA: Diagnosis not present

## 2016-12-22 DIAGNOSIS — M6281 Muscle weakness (generalized): Secondary | ICD-10-CM | POA: Diagnosis not present

## 2016-12-22 DIAGNOSIS — I4891 Unspecified atrial fibrillation: Secondary | ICD-10-CM | POA: Diagnosis not present

## 2016-12-25 DIAGNOSIS — M48062 Spinal stenosis, lumbar region with neurogenic claudication: Secondary | ICD-10-CM | POA: Diagnosis not present

## 2016-12-25 DIAGNOSIS — M6281 Muscle weakness (generalized): Secondary | ICD-10-CM | POA: Diagnosis not present

## 2016-12-25 DIAGNOSIS — K219 Gastro-esophageal reflux disease without esophagitis: Secondary | ICD-10-CM | POA: Diagnosis not present

## 2016-12-25 DIAGNOSIS — I4891 Unspecified atrial fibrillation: Secondary | ICD-10-CM | POA: Diagnosis not present

## 2016-12-25 DIAGNOSIS — M810 Age-related osteoporosis without current pathological fracture: Secondary | ICD-10-CM | POA: Diagnosis not present

## 2016-12-25 DIAGNOSIS — G8918 Other acute postprocedural pain: Secondary | ICD-10-CM | POA: Diagnosis not present

## 2016-12-27 DIAGNOSIS — M810 Age-related osteoporosis without current pathological fracture: Secondary | ICD-10-CM | POA: Diagnosis not present

## 2016-12-27 DIAGNOSIS — M48062 Spinal stenosis, lumbar region with neurogenic claudication: Secondary | ICD-10-CM | POA: Diagnosis not present

## 2016-12-27 DIAGNOSIS — M6281 Muscle weakness (generalized): Secondary | ICD-10-CM | POA: Diagnosis not present

## 2016-12-27 DIAGNOSIS — I4891 Unspecified atrial fibrillation: Secondary | ICD-10-CM | POA: Diagnosis not present

## 2016-12-27 DIAGNOSIS — K219 Gastro-esophageal reflux disease without esophagitis: Secondary | ICD-10-CM | POA: Diagnosis not present

## 2016-12-27 DIAGNOSIS — G8918 Other acute postprocedural pain: Secondary | ICD-10-CM | POA: Diagnosis not present

## 2017-01-01 DIAGNOSIS — M48062 Spinal stenosis, lumbar region with neurogenic claudication: Secondary | ICD-10-CM | POA: Diagnosis not present

## 2017-01-01 DIAGNOSIS — K219 Gastro-esophageal reflux disease without esophagitis: Secondary | ICD-10-CM | POA: Diagnosis not present

## 2017-01-01 DIAGNOSIS — M6281 Muscle weakness (generalized): Secondary | ICD-10-CM | POA: Diagnosis not present

## 2017-01-01 DIAGNOSIS — M810 Age-related osteoporosis without current pathological fracture: Secondary | ICD-10-CM | POA: Diagnosis not present

## 2017-01-01 DIAGNOSIS — G8918 Other acute postprocedural pain: Secondary | ICD-10-CM | POA: Diagnosis not present

## 2017-01-01 DIAGNOSIS — I4891 Unspecified atrial fibrillation: Secondary | ICD-10-CM | POA: Diagnosis not present

## 2017-01-04 DIAGNOSIS — G8918 Other acute postprocedural pain: Secondary | ICD-10-CM | POA: Diagnosis not present

## 2017-01-04 DIAGNOSIS — I4891 Unspecified atrial fibrillation: Secondary | ICD-10-CM | POA: Diagnosis not present

## 2017-01-04 DIAGNOSIS — M810 Age-related osteoporosis without current pathological fracture: Secondary | ICD-10-CM | POA: Diagnosis not present

## 2017-01-04 DIAGNOSIS — M48062 Spinal stenosis, lumbar region with neurogenic claudication: Secondary | ICD-10-CM | POA: Diagnosis not present

## 2017-01-04 DIAGNOSIS — M6281 Muscle weakness (generalized): Secondary | ICD-10-CM | POA: Diagnosis not present

## 2017-01-04 DIAGNOSIS — K219 Gastro-esophageal reflux disease without esophagitis: Secondary | ICD-10-CM | POA: Diagnosis not present

## 2017-01-06 DIAGNOSIS — I4891 Unspecified atrial fibrillation: Secondary | ICD-10-CM | POA: Diagnosis not present

## 2017-01-06 DIAGNOSIS — K219 Gastro-esophageal reflux disease without esophagitis: Secondary | ICD-10-CM | POA: Diagnosis not present

## 2017-01-06 DIAGNOSIS — M48062 Spinal stenosis, lumbar region with neurogenic claudication: Secondary | ICD-10-CM | POA: Diagnosis not present

## 2017-01-06 DIAGNOSIS — M6281 Muscle weakness (generalized): Secondary | ICD-10-CM | POA: Diagnosis not present

## 2017-01-06 DIAGNOSIS — M810 Age-related osteoporosis without current pathological fracture: Secondary | ICD-10-CM | POA: Diagnosis not present

## 2017-01-06 DIAGNOSIS — G8918 Other acute postprocedural pain: Secondary | ICD-10-CM | POA: Diagnosis not present

## 2017-01-08 DIAGNOSIS — G8918 Other acute postprocedural pain: Secondary | ICD-10-CM | POA: Diagnosis not present

## 2017-01-08 DIAGNOSIS — M810 Age-related osteoporosis without current pathological fracture: Secondary | ICD-10-CM | POA: Diagnosis not present

## 2017-01-08 DIAGNOSIS — M6281 Muscle weakness (generalized): Secondary | ICD-10-CM | POA: Diagnosis not present

## 2017-01-08 DIAGNOSIS — K219 Gastro-esophageal reflux disease without esophagitis: Secondary | ICD-10-CM | POA: Diagnosis not present

## 2017-01-08 DIAGNOSIS — M48062 Spinal stenosis, lumbar region with neurogenic claudication: Secondary | ICD-10-CM | POA: Diagnosis not present

## 2017-01-08 DIAGNOSIS — I4891 Unspecified atrial fibrillation: Secondary | ICD-10-CM | POA: Diagnosis not present

## 2017-01-10 DIAGNOSIS — M810 Age-related osteoporosis without current pathological fracture: Secondary | ICD-10-CM | POA: Diagnosis not present

## 2017-01-10 DIAGNOSIS — K219 Gastro-esophageal reflux disease without esophagitis: Secondary | ICD-10-CM | POA: Diagnosis not present

## 2017-01-10 DIAGNOSIS — M48062 Spinal stenosis, lumbar region with neurogenic claudication: Secondary | ICD-10-CM | POA: Diagnosis not present

## 2017-01-10 DIAGNOSIS — M6281 Muscle weakness (generalized): Secondary | ICD-10-CM | POA: Diagnosis not present

## 2017-01-10 DIAGNOSIS — I4891 Unspecified atrial fibrillation: Secondary | ICD-10-CM | POA: Diagnosis not present

## 2017-01-10 DIAGNOSIS — G8918 Other acute postprocedural pain: Secondary | ICD-10-CM | POA: Diagnosis not present

## 2017-01-11 DIAGNOSIS — M810 Age-related osteoporosis without current pathological fracture: Secondary | ICD-10-CM | POA: Diagnosis not present

## 2017-01-11 DIAGNOSIS — G8918 Other acute postprocedural pain: Secondary | ICD-10-CM | POA: Diagnosis not present

## 2017-01-11 DIAGNOSIS — K219 Gastro-esophageal reflux disease without esophagitis: Secondary | ICD-10-CM | POA: Diagnosis not present

## 2017-01-11 DIAGNOSIS — I4891 Unspecified atrial fibrillation: Secondary | ICD-10-CM | POA: Diagnosis not present

## 2017-01-11 DIAGNOSIS — M6281 Muscle weakness (generalized): Secondary | ICD-10-CM | POA: Diagnosis not present

## 2017-01-11 DIAGNOSIS — M48062 Spinal stenosis, lumbar region with neurogenic claudication: Secondary | ICD-10-CM | POA: Diagnosis not present

## 2017-01-12 DIAGNOSIS — G8918 Other acute postprocedural pain: Secondary | ICD-10-CM | POA: Diagnosis not present

## 2017-01-12 DIAGNOSIS — M48062 Spinal stenosis, lumbar region with neurogenic claudication: Secondary | ICD-10-CM | POA: Diagnosis not present

## 2017-01-12 DIAGNOSIS — M6281 Muscle weakness (generalized): Secondary | ICD-10-CM | POA: Diagnosis not present

## 2017-01-12 DIAGNOSIS — M810 Age-related osteoporosis without current pathological fracture: Secondary | ICD-10-CM | POA: Diagnosis not present

## 2017-01-12 DIAGNOSIS — K219 Gastro-esophageal reflux disease without esophagitis: Secondary | ICD-10-CM | POA: Diagnosis not present

## 2017-01-12 DIAGNOSIS — I4891 Unspecified atrial fibrillation: Secondary | ICD-10-CM | POA: Diagnosis not present

## 2017-01-14 DIAGNOSIS — M48062 Spinal stenosis, lumbar region with neurogenic claudication: Secondary | ICD-10-CM | POA: Diagnosis not present

## 2017-01-14 DIAGNOSIS — R262 Difficulty in walking, not elsewhere classified: Secondary | ICD-10-CM | POA: Diagnosis not present

## 2017-01-17 DIAGNOSIS — M810 Age-related osteoporosis without current pathological fracture: Secondary | ICD-10-CM | POA: Diagnosis not present

## 2017-01-17 DIAGNOSIS — M48062 Spinal stenosis, lumbar region with neurogenic claudication: Secondary | ICD-10-CM | POA: Diagnosis not present

## 2017-01-17 DIAGNOSIS — G8918 Other acute postprocedural pain: Secondary | ICD-10-CM | POA: Diagnosis not present

## 2017-01-17 DIAGNOSIS — I4891 Unspecified atrial fibrillation: Secondary | ICD-10-CM | POA: Diagnosis not present

## 2017-01-17 DIAGNOSIS — M6281 Muscle weakness (generalized): Secondary | ICD-10-CM | POA: Diagnosis not present

## 2017-01-17 DIAGNOSIS — K219 Gastro-esophageal reflux disease without esophagitis: Secondary | ICD-10-CM | POA: Diagnosis not present

## 2017-01-19 DIAGNOSIS — M6281 Muscle weakness (generalized): Secondary | ICD-10-CM | POA: Diagnosis not present

## 2017-01-19 DIAGNOSIS — M810 Age-related osteoporosis without current pathological fracture: Secondary | ICD-10-CM | POA: Diagnosis not present

## 2017-01-19 DIAGNOSIS — K219 Gastro-esophageal reflux disease without esophagitis: Secondary | ICD-10-CM | POA: Diagnosis not present

## 2017-01-19 DIAGNOSIS — G8918 Other acute postprocedural pain: Secondary | ICD-10-CM | POA: Diagnosis not present

## 2017-01-19 DIAGNOSIS — M48062 Spinal stenosis, lumbar region with neurogenic claudication: Secondary | ICD-10-CM | POA: Diagnosis not present

## 2017-01-19 DIAGNOSIS — I4891 Unspecified atrial fibrillation: Secondary | ICD-10-CM | POA: Diagnosis not present

## 2017-01-20 DIAGNOSIS — M810 Age-related osteoporosis without current pathological fracture: Secondary | ICD-10-CM | POA: Diagnosis not present

## 2017-01-20 DIAGNOSIS — M6281 Muscle weakness (generalized): Secondary | ICD-10-CM | POA: Diagnosis not present

## 2017-01-20 DIAGNOSIS — G8918 Other acute postprocedural pain: Secondary | ICD-10-CM | POA: Diagnosis not present

## 2017-01-20 DIAGNOSIS — I4891 Unspecified atrial fibrillation: Secondary | ICD-10-CM | POA: Diagnosis not present

## 2017-01-20 DIAGNOSIS — K219 Gastro-esophageal reflux disease without esophagitis: Secondary | ICD-10-CM | POA: Diagnosis not present

## 2017-01-20 DIAGNOSIS — M48062 Spinal stenosis, lumbar region with neurogenic claudication: Secondary | ICD-10-CM | POA: Diagnosis not present

## 2017-01-22 DIAGNOSIS — M48062 Spinal stenosis, lumbar region with neurogenic claudication: Secondary | ICD-10-CM | POA: Diagnosis not present

## 2017-01-22 DIAGNOSIS — I4891 Unspecified atrial fibrillation: Secondary | ICD-10-CM | POA: Diagnosis not present

## 2017-01-22 DIAGNOSIS — K219 Gastro-esophageal reflux disease without esophagitis: Secondary | ICD-10-CM | POA: Diagnosis not present

## 2017-01-22 DIAGNOSIS — G8918 Other acute postprocedural pain: Secondary | ICD-10-CM | POA: Diagnosis not present

## 2017-01-22 DIAGNOSIS — M810 Age-related osteoporosis without current pathological fracture: Secondary | ICD-10-CM | POA: Diagnosis not present

## 2017-01-22 DIAGNOSIS — M6281 Muscle weakness (generalized): Secondary | ICD-10-CM | POA: Diagnosis not present

## 2017-01-24 DIAGNOSIS — M810 Age-related osteoporosis without current pathological fracture: Secondary | ICD-10-CM | POA: Diagnosis not present

## 2017-01-24 DIAGNOSIS — K219 Gastro-esophageal reflux disease without esophagitis: Secondary | ICD-10-CM | POA: Diagnosis not present

## 2017-01-24 DIAGNOSIS — G8918 Other acute postprocedural pain: Secondary | ICD-10-CM | POA: Diagnosis not present

## 2017-01-24 DIAGNOSIS — M6281 Muscle weakness (generalized): Secondary | ICD-10-CM | POA: Diagnosis not present

## 2017-01-24 DIAGNOSIS — M48062 Spinal stenosis, lumbar region with neurogenic claudication: Secondary | ICD-10-CM | POA: Diagnosis not present

## 2017-01-24 DIAGNOSIS — I4891 Unspecified atrial fibrillation: Secondary | ICD-10-CM | POA: Diagnosis not present

## 2017-01-26 DIAGNOSIS — G8918 Other acute postprocedural pain: Secondary | ICD-10-CM | POA: Diagnosis not present

## 2017-01-26 DIAGNOSIS — M6281 Muscle weakness (generalized): Secondary | ICD-10-CM | POA: Diagnosis not present

## 2017-01-26 DIAGNOSIS — I4891 Unspecified atrial fibrillation: Secondary | ICD-10-CM | POA: Diagnosis not present

## 2017-01-26 DIAGNOSIS — M810 Age-related osteoporosis without current pathological fracture: Secondary | ICD-10-CM | POA: Diagnosis not present

## 2017-01-26 DIAGNOSIS — M48062 Spinal stenosis, lumbar region with neurogenic claudication: Secondary | ICD-10-CM | POA: Diagnosis not present

## 2017-01-26 DIAGNOSIS — K219 Gastro-esophageal reflux disease without esophagitis: Secondary | ICD-10-CM | POA: Diagnosis not present

## 2017-01-29 DIAGNOSIS — G8918 Other acute postprocedural pain: Secondary | ICD-10-CM | POA: Diagnosis not present

## 2017-01-29 DIAGNOSIS — M48062 Spinal stenosis, lumbar region with neurogenic claudication: Secondary | ICD-10-CM | POA: Diagnosis not present

## 2017-01-29 DIAGNOSIS — M810 Age-related osteoporosis without current pathological fracture: Secondary | ICD-10-CM | POA: Diagnosis not present

## 2017-01-29 DIAGNOSIS — M6281 Muscle weakness (generalized): Secondary | ICD-10-CM | POA: Diagnosis not present

## 2017-01-29 DIAGNOSIS — I4891 Unspecified atrial fibrillation: Secondary | ICD-10-CM | POA: Diagnosis not present

## 2017-01-29 DIAGNOSIS — K219 Gastro-esophageal reflux disease without esophagitis: Secondary | ICD-10-CM | POA: Diagnosis not present

## 2017-02-01 DIAGNOSIS — G8918 Other acute postprocedural pain: Secondary | ICD-10-CM | POA: Diagnosis not present

## 2017-02-01 DIAGNOSIS — M6281 Muscle weakness (generalized): Secondary | ICD-10-CM | POA: Diagnosis not present

## 2017-02-01 DIAGNOSIS — K219 Gastro-esophageal reflux disease without esophagitis: Secondary | ICD-10-CM | POA: Diagnosis not present

## 2017-02-01 DIAGNOSIS — M810 Age-related osteoporosis without current pathological fracture: Secondary | ICD-10-CM | POA: Diagnosis not present

## 2017-02-01 DIAGNOSIS — I4891 Unspecified atrial fibrillation: Secondary | ICD-10-CM | POA: Diagnosis not present

## 2017-02-01 DIAGNOSIS — M48062 Spinal stenosis, lumbar region with neurogenic claudication: Secondary | ICD-10-CM | POA: Diagnosis not present

## 2017-02-02 DIAGNOSIS — G8918 Other acute postprocedural pain: Secondary | ICD-10-CM | POA: Diagnosis not present

## 2017-02-02 DIAGNOSIS — M810 Age-related osteoporosis without current pathological fracture: Secondary | ICD-10-CM | POA: Diagnosis not present

## 2017-02-02 DIAGNOSIS — M48062 Spinal stenosis, lumbar region with neurogenic claudication: Secondary | ICD-10-CM | POA: Diagnosis not present

## 2017-02-02 DIAGNOSIS — K219 Gastro-esophageal reflux disease without esophagitis: Secondary | ICD-10-CM | POA: Diagnosis not present

## 2017-02-02 DIAGNOSIS — I4891 Unspecified atrial fibrillation: Secondary | ICD-10-CM | POA: Diagnosis not present

## 2017-02-02 DIAGNOSIS — M6281 Muscle weakness (generalized): Secondary | ICD-10-CM | POA: Diagnosis not present

## 2017-02-07 DIAGNOSIS — G8918 Other acute postprocedural pain: Secondary | ICD-10-CM | POA: Diagnosis not present

## 2017-02-07 DIAGNOSIS — I4891 Unspecified atrial fibrillation: Secondary | ICD-10-CM | POA: Diagnosis not present

## 2017-02-07 DIAGNOSIS — M48062 Spinal stenosis, lumbar region with neurogenic claudication: Secondary | ICD-10-CM | POA: Diagnosis not present

## 2017-02-07 DIAGNOSIS — M810 Age-related osteoporosis without current pathological fracture: Secondary | ICD-10-CM | POA: Diagnosis not present

## 2017-02-07 DIAGNOSIS — M6281 Muscle weakness (generalized): Secondary | ICD-10-CM | POA: Diagnosis not present

## 2017-02-07 DIAGNOSIS — K219 Gastro-esophageal reflux disease without esophagitis: Secondary | ICD-10-CM | POA: Diagnosis not present

## 2017-02-08 DIAGNOSIS — M6281 Muscle weakness (generalized): Secondary | ICD-10-CM | POA: Diagnosis not present

## 2017-02-08 DIAGNOSIS — M48062 Spinal stenosis, lumbar region with neurogenic claudication: Secondary | ICD-10-CM | POA: Diagnosis not present

## 2017-02-08 DIAGNOSIS — I4891 Unspecified atrial fibrillation: Secondary | ICD-10-CM | POA: Diagnosis not present

## 2017-02-08 DIAGNOSIS — K219 Gastro-esophageal reflux disease without esophagitis: Secondary | ICD-10-CM | POA: Diagnosis not present

## 2017-02-08 DIAGNOSIS — M810 Age-related osteoporosis without current pathological fracture: Secondary | ICD-10-CM | POA: Diagnosis not present

## 2017-02-08 DIAGNOSIS — G8918 Other acute postprocedural pain: Secondary | ICD-10-CM | POA: Diagnosis not present

## 2017-02-09 DIAGNOSIS — G8918 Other acute postprocedural pain: Secondary | ICD-10-CM | POA: Diagnosis not present

## 2017-02-09 DIAGNOSIS — M48062 Spinal stenosis, lumbar region with neurogenic claudication: Secondary | ICD-10-CM | POA: Diagnosis not present

## 2017-02-09 DIAGNOSIS — M810 Age-related osteoporosis without current pathological fracture: Secondary | ICD-10-CM | POA: Diagnosis not present

## 2017-02-09 DIAGNOSIS — K219 Gastro-esophageal reflux disease without esophagitis: Secondary | ICD-10-CM | POA: Diagnosis not present

## 2017-02-09 DIAGNOSIS — I4891 Unspecified atrial fibrillation: Secondary | ICD-10-CM | POA: Diagnosis not present

## 2017-02-09 DIAGNOSIS — M6281 Muscle weakness (generalized): Secondary | ICD-10-CM | POA: Diagnosis not present

## 2017-02-12 DIAGNOSIS — M4726 Other spondylosis with radiculopathy, lumbar region: Secondary | ICD-10-CM | POA: Diagnosis not present

## 2017-02-12 DIAGNOSIS — M5431 Sciatica, right side: Secondary | ICD-10-CM | POA: Diagnosis not present

## 2017-02-12 DIAGNOSIS — M5432 Sciatica, left side: Secondary | ICD-10-CM | POA: Diagnosis not present

## 2017-02-12 DIAGNOSIS — M48062 Spinal stenosis, lumbar region with neurogenic claudication: Secondary | ICD-10-CM | POA: Diagnosis not present

## 2017-02-13 DIAGNOSIS — M48062 Spinal stenosis, lumbar region with neurogenic claudication: Secondary | ICD-10-CM | POA: Diagnosis not present

## 2017-02-13 DIAGNOSIS — I4891 Unspecified atrial fibrillation: Secondary | ICD-10-CM | POA: Diagnosis not present

## 2017-02-13 DIAGNOSIS — M810 Age-related osteoporosis without current pathological fracture: Secondary | ICD-10-CM | POA: Diagnosis not present

## 2017-02-13 DIAGNOSIS — G8918 Other acute postprocedural pain: Secondary | ICD-10-CM | POA: Diagnosis not present

## 2017-02-13 DIAGNOSIS — K219 Gastro-esophageal reflux disease without esophagitis: Secondary | ICD-10-CM | POA: Diagnosis not present

## 2017-02-13 DIAGNOSIS — M6281 Muscle weakness (generalized): Secondary | ICD-10-CM | POA: Diagnosis not present

## 2017-02-14 DIAGNOSIS — M48062 Spinal stenosis, lumbar region with neurogenic claudication: Secondary | ICD-10-CM | POA: Diagnosis not present

## 2017-02-14 DIAGNOSIS — R262 Difficulty in walking, not elsewhere classified: Secondary | ICD-10-CM | POA: Diagnosis not present

## 2017-02-15 DIAGNOSIS — G8918 Other acute postprocedural pain: Secondary | ICD-10-CM | POA: Diagnosis not present

## 2017-02-15 DIAGNOSIS — K219 Gastro-esophageal reflux disease without esophagitis: Secondary | ICD-10-CM | POA: Diagnosis not present

## 2017-02-15 DIAGNOSIS — M48062 Spinal stenosis, lumbar region with neurogenic claudication: Secondary | ICD-10-CM | POA: Diagnosis not present

## 2017-02-15 DIAGNOSIS — I4891 Unspecified atrial fibrillation: Secondary | ICD-10-CM | POA: Diagnosis not present

## 2017-02-15 DIAGNOSIS — M6281 Muscle weakness (generalized): Secondary | ICD-10-CM | POA: Diagnosis not present

## 2017-02-15 DIAGNOSIS — M810 Age-related osteoporosis without current pathological fracture: Secondary | ICD-10-CM | POA: Diagnosis not present

## 2017-02-20 DIAGNOSIS — G8918 Other acute postprocedural pain: Secondary | ICD-10-CM | POA: Diagnosis not present

## 2017-02-20 DIAGNOSIS — M810 Age-related osteoporosis without current pathological fracture: Secondary | ICD-10-CM | POA: Diagnosis not present

## 2017-02-20 DIAGNOSIS — M6281 Muscle weakness (generalized): Secondary | ICD-10-CM | POA: Diagnosis not present

## 2017-02-20 DIAGNOSIS — I4891 Unspecified atrial fibrillation: Secondary | ICD-10-CM | POA: Diagnosis not present

## 2017-02-20 DIAGNOSIS — M48062 Spinal stenosis, lumbar region with neurogenic claudication: Secondary | ICD-10-CM | POA: Diagnosis not present

## 2017-02-20 DIAGNOSIS — K219 Gastro-esophageal reflux disease without esophagitis: Secondary | ICD-10-CM | POA: Diagnosis not present

## 2017-02-22 DIAGNOSIS — M48062 Spinal stenosis, lumbar region with neurogenic claudication: Secondary | ICD-10-CM | POA: Diagnosis not present

## 2017-02-22 DIAGNOSIS — M810 Age-related osteoporosis without current pathological fracture: Secondary | ICD-10-CM | POA: Diagnosis not present

## 2017-02-22 DIAGNOSIS — I4891 Unspecified atrial fibrillation: Secondary | ICD-10-CM | POA: Diagnosis not present

## 2017-02-22 DIAGNOSIS — K219 Gastro-esophageal reflux disease without esophagitis: Secondary | ICD-10-CM | POA: Diagnosis not present

## 2017-02-22 DIAGNOSIS — M6281 Muscle weakness (generalized): Secondary | ICD-10-CM | POA: Diagnosis not present

## 2017-02-22 DIAGNOSIS — G8918 Other acute postprocedural pain: Secondary | ICD-10-CM | POA: Diagnosis not present

## 2017-02-28 DIAGNOSIS — M48062 Spinal stenosis, lumbar region with neurogenic claudication: Secondary | ICD-10-CM | POA: Diagnosis not present

## 2017-02-28 DIAGNOSIS — M810 Age-related osteoporosis without current pathological fracture: Secondary | ICD-10-CM | POA: Diagnosis not present

## 2017-02-28 DIAGNOSIS — G8918 Other acute postprocedural pain: Secondary | ICD-10-CM | POA: Diagnosis not present

## 2017-02-28 DIAGNOSIS — M6281 Muscle weakness (generalized): Secondary | ICD-10-CM | POA: Diagnosis not present

## 2017-02-28 DIAGNOSIS — I4891 Unspecified atrial fibrillation: Secondary | ICD-10-CM | POA: Diagnosis not present

## 2017-02-28 DIAGNOSIS — K219 Gastro-esophageal reflux disease without esophagitis: Secondary | ICD-10-CM | POA: Diagnosis not present

## 2017-03-05 DIAGNOSIS — E039 Hypothyroidism, unspecified: Secondary | ICD-10-CM | POA: Diagnosis not present

## 2017-03-05 DIAGNOSIS — R5381 Other malaise: Secondary | ICD-10-CM | POA: Diagnosis not present

## 2017-03-05 DIAGNOSIS — E7800 Pure hypercholesterolemia, unspecified: Secondary | ICD-10-CM | POA: Diagnosis not present

## 2017-03-05 DIAGNOSIS — J309 Allergic rhinitis, unspecified: Secondary | ICD-10-CM | POA: Diagnosis not present

## 2017-03-05 DIAGNOSIS — K219 Gastro-esophageal reflux disease without esophagitis: Secondary | ICD-10-CM | POA: Diagnosis not present

## 2017-03-05 DIAGNOSIS — R600 Localized edema: Secondary | ICD-10-CM | POA: Diagnosis not present

## 2017-03-05 DIAGNOSIS — N183 Chronic kidney disease, stage 3 (moderate): Secondary | ICD-10-CM | POA: Diagnosis not present

## 2017-03-05 DIAGNOSIS — I4891 Unspecified atrial fibrillation: Secondary | ICD-10-CM | POA: Diagnosis not present

## 2017-03-05 DIAGNOSIS — I10 Essential (primary) hypertension: Secondary | ICD-10-CM | POA: Diagnosis not present

## 2017-03-05 DIAGNOSIS — J449 Chronic obstructive pulmonary disease, unspecified: Secondary | ICD-10-CM | POA: Diagnosis not present

## 2017-03-07 DIAGNOSIS — E7800 Pure hypercholesterolemia, unspecified: Secondary | ICD-10-CM | POA: Diagnosis not present

## 2017-03-07 DIAGNOSIS — N183 Chronic kidney disease, stage 3 (moderate): Secondary | ICD-10-CM | POA: Diagnosis not present

## 2017-03-07 DIAGNOSIS — I4891 Unspecified atrial fibrillation: Secondary | ICD-10-CM | POA: Diagnosis not present

## 2017-03-07 DIAGNOSIS — I10 Essential (primary) hypertension: Secondary | ICD-10-CM | POA: Diagnosis not present

## 2017-03-07 DIAGNOSIS — R600 Localized edema: Secondary | ICD-10-CM | POA: Diagnosis not present

## 2017-03-07 DIAGNOSIS — R911 Solitary pulmonary nodule: Secondary | ICD-10-CM | POA: Diagnosis not present

## 2017-03-07 DIAGNOSIS — J449 Chronic obstructive pulmonary disease, unspecified: Secondary | ICD-10-CM | POA: Diagnosis not present

## 2017-03-07 DIAGNOSIS — H60509 Unspecified acute noninfective otitis externa, unspecified ear: Secondary | ICD-10-CM | POA: Diagnosis not present

## 2017-03-07 DIAGNOSIS — E039 Hypothyroidism, unspecified: Secondary | ICD-10-CM | POA: Diagnosis not present

## 2017-03-07 DIAGNOSIS — J309 Allergic rhinitis, unspecified: Secondary | ICD-10-CM | POA: Diagnosis not present

## 2017-03-12 DIAGNOSIS — M6281 Muscle weakness (generalized): Secondary | ICD-10-CM | POA: Diagnosis not present

## 2017-03-12 DIAGNOSIS — R2689 Other abnormalities of gait and mobility: Secondary | ICD-10-CM | POA: Diagnosis not present

## 2017-03-12 DIAGNOSIS — R2681 Unsteadiness on feet: Secondary | ICD-10-CM | POA: Diagnosis not present

## 2017-03-16 DIAGNOSIS — M6281 Muscle weakness (generalized): Secondary | ICD-10-CM | POA: Diagnosis not present

## 2017-03-16 DIAGNOSIS — R2689 Other abnormalities of gait and mobility: Secondary | ICD-10-CM | POA: Diagnosis not present

## 2017-03-16 DIAGNOSIS — R2681 Unsteadiness on feet: Secondary | ICD-10-CM | POA: Diagnosis not present

## 2017-03-17 DIAGNOSIS — M48062 Spinal stenosis, lumbar region with neurogenic claudication: Secondary | ICD-10-CM | POA: Diagnosis not present

## 2017-03-17 DIAGNOSIS — R262 Difficulty in walking, not elsewhere classified: Secondary | ICD-10-CM | POA: Diagnosis not present

## 2017-03-20 DIAGNOSIS — M6281 Muscle weakness (generalized): Secondary | ICD-10-CM | POA: Diagnosis not present

## 2017-03-20 DIAGNOSIS — R2689 Other abnormalities of gait and mobility: Secondary | ICD-10-CM | POA: Diagnosis not present

## 2017-03-20 DIAGNOSIS — R2681 Unsteadiness on feet: Secondary | ICD-10-CM | POA: Diagnosis not present

## 2017-03-22 DIAGNOSIS — M6281 Muscle weakness (generalized): Secondary | ICD-10-CM | POA: Diagnosis not present

## 2017-03-22 DIAGNOSIS — R2681 Unsteadiness on feet: Secondary | ICD-10-CM | POA: Diagnosis not present

## 2017-03-22 DIAGNOSIS — R2689 Other abnormalities of gait and mobility: Secondary | ICD-10-CM | POA: Diagnosis not present

## 2017-03-27 DIAGNOSIS — R2689 Other abnormalities of gait and mobility: Secondary | ICD-10-CM | POA: Diagnosis not present

## 2017-03-27 DIAGNOSIS — R2681 Unsteadiness on feet: Secondary | ICD-10-CM | POA: Diagnosis not present

## 2017-03-27 DIAGNOSIS — M6281 Muscle weakness (generalized): Secondary | ICD-10-CM | POA: Diagnosis not present

## 2017-03-29 DIAGNOSIS — R2681 Unsteadiness on feet: Secondary | ICD-10-CM | POA: Diagnosis not present

## 2017-03-29 DIAGNOSIS — R2689 Other abnormalities of gait and mobility: Secondary | ICD-10-CM | POA: Diagnosis not present

## 2017-03-29 DIAGNOSIS — M6281 Muscle weakness (generalized): Secondary | ICD-10-CM | POA: Diagnosis not present

## 2017-04-03 DIAGNOSIS — M6281 Muscle weakness (generalized): Secondary | ICD-10-CM | POA: Diagnosis not present

## 2017-04-03 DIAGNOSIS — R2681 Unsteadiness on feet: Secondary | ICD-10-CM | POA: Diagnosis not present

## 2017-04-03 DIAGNOSIS — R2689 Other abnormalities of gait and mobility: Secondary | ICD-10-CM | POA: Diagnosis not present

## 2017-04-05 DIAGNOSIS — R2689 Other abnormalities of gait and mobility: Secondary | ICD-10-CM | POA: Diagnosis not present

## 2017-04-05 DIAGNOSIS — R2681 Unsteadiness on feet: Secondary | ICD-10-CM | POA: Diagnosis not present

## 2017-04-05 DIAGNOSIS — M6281 Muscle weakness (generalized): Secondary | ICD-10-CM | POA: Diagnosis not present

## 2017-04-10 DIAGNOSIS — M6281 Muscle weakness (generalized): Secondary | ICD-10-CM | POA: Diagnosis not present

## 2017-04-10 DIAGNOSIS — R2689 Other abnormalities of gait and mobility: Secondary | ICD-10-CM | POA: Diagnosis not present

## 2017-04-10 DIAGNOSIS — R2681 Unsteadiness on feet: Secondary | ICD-10-CM | POA: Diagnosis not present

## 2017-04-12 DIAGNOSIS — M6281 Muscle weakness (generalized): Secondary | ICD-10-CM | POA: Diagnosis not present

## 2017-04-12 DIAGNOSIS — R2689 Other abnormalities of gait and mobility: Secondary | ICD-10-CM | POA: Diagnosis not present

## 2017-04-12 DIAGNOSIS — R2681 Unsteadiness on feet: Secondary | ICD-10-CM | POA: Diagnosis not present

## 2017-04-14 DIAGNOSIS — M48062 Spinal stenosis, lumbar region with neurogenic claudication: Secondary | ICD-10-CM | POA: Diagnosis not present

## 2017-04-14 DIAGNOSIS — R262 Difficulty in walking, not elsewhere classified: Secondary | ICD-10-CM | POA: Diagnosis not present

## 2017-04-17 DIAGNOSIS — R2681 Unsteadiness on feet: Secondary | ICD-10-CM | POA: Diagnosis not present

## 2017-04-17 DIAGNOSIS — M6281 Muscle weakness (generalized): Secondary | ICD-10-CM | POA: Diagnosis not present

## 2017-04-17 DIAGNOSIS — R2689 Other abnormalities of gait and mobility: Secondary | ICD-10-CM | POA: Diagnosis not present

## 2017-04-19 DIAGNOSIS — M6281 Muscle weakness (generalized): Secondary | ICD-10-CM | POA: Diagnosis not present

## 2017-04-19 DIAGNOSIS — R2681 Unsteadiness on feet: Secondary | ICD-10-CM | POA: Diagnosis not present

## 2017-04-19 DIAGNOSIS — R2689 Other abnormalities of gait and mobility: Secondary | ICD-10-CM | POA: Diagnosis not present

## 2017-04-24 DIAGNOSIS — R2681 Unsteadiness on feet: Secondary | ICD-10-CM | POA: Diagnosis not present

## 2017-04-24 DIAGNOSIS — R2689 Other abnormalities of gait and mobility: Secondary | ICD-10-CM | POA: Diagnosis not present

## 2017-04-24 DIAGNOSIS — M6281 Muscle weakness (generalized): Secondary | ICD-10-CM | POA: Diagnosis not present

## 2017-04-26 DIAGNOSIS — M6281 Muscle weakness (generalized): Secondary | ICD-10-CM | POA: Diagnosis not present

## 2017-04-26 DIAGNOSIS — R2681 Unsteadiness on feet: Secondary | ICD-10-CM | POA: Diagnosis not present

## 2017-04-26 DIAGNOSIS — R2689 Other abnormalities of gait and mobility: Secondary | ICD-10-CM | POA: Diagnosis not present

## 2017-05-01 DIAGNOSIS — R2689 Other abnormalities of gait and mobility: Secondary | ICD-10-CM | POA: Diagnosis not present

## 2017-05-01 DIAGNOSIS — R2681 Unsteadiness on feet: Secondary | ICD-10-CM | POA: Diagnosis not present

## 2017-05-01 DIAGNOSIS — M6281 Muscle weakness (generalized): Secondary | ICD-10-CM | POA: Diagnosis not present

## 2017-05-03 DIAGNOSIS — R2689 Other abnormalities of gait and mobility: Secondary | ICD-10-CM | POA: Diagnosis not present

## 2017-05-03 DIAGNOSIS — R2681 Unsteadiness on feet: Secondary | ICD-10-CM | POA: Diagnosis not present

## 2017-05-03 DIAGNOSIS — M6281 Muscle weakness (generalized): Secondary | ICD-10-CM | POA: Diagnosis not present

## 2017-05-08 DIAGNOSIS — R2689 Other abnormalities of gait and mobility: Secondary | ICD-10-CM | POA: Diagnosis not present

## 2017-05-08 DIAGNOSIS — M6281 Muscle weakness (generalized): Secondary | ICD-10-CM | POA: Diagnosis not present

## 2017-05-08 DIAGNOSIS — R2681 Unsteadiness on feet: Secondary | ICD-10-CM | POA: Diagnosis not present

## 2017-05-10 DIAGNOSIS — R2681 Unsteadiness on feet: Secondary | ICD-10-CM | POA: Diagnosis not present

## 2017-05-10 DIAGNOSIS — M6281 Muscle weakness (generalized): Secondary | ICD-10-CM | POA: Diagnosis not present

## 2017-05-10 DIAGNOSIS — R2689 Other abnormalities of gait and mobility: Secondary | ICD-10-CM | POA: Diagnosis not present

## 2017-05-14 DIAGNOSIS — I4891 Unspecified atrial fibrillation: Secondary | ICD-10-CM | POA: Diagnosis not present

## 2017-05-14 DIAGNOSIS — J449 Chronic obstructive pulmonary disease, unspecified: Secondary | ICD-10-CM | POA: Diagnosis not present

## 2017-05-14 DIAGNOSIS — K922 Gastrointestinal hemorrhage, unspecified: Secondary | ICD-10-CM | POA: Diagnosis not present

## 2017-05-14 DIAGNOSIS — N183 Chronic kidney disease, stage 3 (moderate): Secondary | ICD-10-CM | POA: Diagnosis not present

## 2017-05-14 DIAGNOSIS — R600 Localized edema: Secondary | ICD-10-CM | POA: Diagnosis not present

## 2017-05-14 DIAGNOSIS — J309 Allergic rhinitis, unspecified: Secondary | ICD-10-CM | POA: Diagnosis not present

## 2017-05-14 DIAGNOSIS — L0291 Cutaneous abscess, unspecified: Secondary | ICD-10-CM | POA: Diagnosis not present

## 2017-05-14 DIAGNOSIS — M159 Polyosteoarthritis, unspecified: Secondary | ICD-10-CM | POA: Diagnosis not present

## 2017-05-14 DIAGNOSIS — Z1331 Encounter for screening for depression: Secondary | ICD-10-CM | POA: Diagnosis not present

## 2017-05-14 DIAGNOSIS — R911 Solitary pulmonary nodule: Secondary | ICD-10-CM | POA: Diagnosis not present

## 2017-05-15 DIAGNOSIS — R262 Difficulty in walking, not elsewhere classified: Secondary | ICD-10-CM | POA: Diagnosis not present

## 2017-05-15 DIAGNOSIS — M6281 Muscle weakness (generalized): Secondary | ICD-10-CM | POA: Diagnosis not present

## 2017-05-15 DIAGNOSIS — M48062 Spinal stenosis, lumbar region with neurogenic claudication: Secondary | ICD-10-CM | POA: Diagnosis not present

## 2017-05-15 DIAGNOSIS — R2689 Other abnormalities of gait and mobility: Secondary | ICD-10-CM | POA: Diagnosis not present

## 2017-05-15 DIAGNOSIS — R2681 Unsteadiness on feet: Secondary | ICD-10-CM | POA: Diagnosis not present

## 2017-05-15 DIAGNOSIS — M5431 Sciatica, right side: Secondary | ICD-10-CM | POA: Diagnosis not present

## 2017-05-15 DIAGNOSIS — M5432 Sciatica, left side: Secondary | ICD-10-CM | POA: Diagnosis not present

## 2017-05-15 DIAGNOSIS — M4726 Other spondylosis with radiculopathy, lumbar region: Secondary | ICD-10-CM | POA: Diagnosis not present

## 2017-05-16 DIAGNOSIS — J449 Chronic obstructive pulmonary disease, unspecified: Secondary | ICD-10-CM | POA: Diagnosis not present

## 2017-05-16 DIAGNOSIS — K922 Gastrointestinal hemorrhage, unspecified: Secondary | ICD-10-CM | POA: Diagnosis not present

## 2017-05-16 DIAGNOSIS — I4891 Unspecified atrial fibrillation: Secondary | ICD-10-CM | POA: Diagnosis not present

## 2017-05-16 DIAGNOSIS — M159 Polyosteoarthritis, unspecified: Secondary | ICD-10-CM | POA: Diagnosis not present

## 2017-05-16 DIAGNOSIS — Z1331 Encounter for screening for depression: Secondary | ICD-10-CM | POA: Diagnosis not present

## 2017-05-16 DIAGNOSIS — N183 Chronic kidney disease, stage 3 (moderate): Secondary | ICD-10-CM | POA: Diagnosis not present

## 2017-05-16 DIAGNOSIS — R600 Localized edema: Secondary | ICD-10-CM | POA: Diagnosis not present

## 2017-05-16 DIAGNOSIS — J309 Allergic rhinitis, unspecified: Secondary | ICD-10-CM | POA: Diagnosis not present

## 2017-05-16 DIAGNOSIS — L0291 Cutaneous abscess, unspecified: Secondary | ICD-10-CM | POA: Diagnosis not present

## 2017-05-16 DIAGNOSIS — R911 Solitary pulmonary nodule: Secondary | ICD-10-CM | POA: Diagnosis not present

## 2017-05-17 DIAGNOSIS — R2689 Other abnormalities of gait and mobility: Secondary | ICD-10-CM | POA: Diagnosis not present

## 2017-05-17 DIAGNOSIS — R2681 Unsteadiness on feet: Secondary | ICD-10-CM | POA: Diagnosis not present

## 2017-05-17 DIAGNOSIS — M6281 Muscle weakness (generalized): Secondary | ICD-10-CM | POA: Diagnosis not present

## 2017-05-22 DIAGNOSIS — R2681 Unsteadiness on feet: Secondary | ICD-10-CM | POA: Diagnosis not present

## 2017-05-22 DIAGNOSIS — K922 Gastrointestinal hemorrhage, unspecified: Secondary | ICD-10-CM | POA: Diagnosis not present

## 2017-05-22 DIAGNOSIS — J449 Chronic obstructive pulmonary disease, unspecified: Secondary | ICD-10-CM | POA: Diagnosis not present

## 2017-05-22 DIAGNOSIS — R911 Solitary pulmonary nodule: Secondary | ICD-10-CM | POA: Diagnosis not present

## 2017-05-22 DIAGNOSIS — I4891 Unspecified atrial fibrillation: Secondary | ICD-10-CM | POA: Diagnosis not present

## 2017-05-22 DIAGNOSIS — R2689 Other abnormalities of gait and mobility: Secondary | ICD-10-CM | POA: Diagnosis not present

## 2017-05-22 DIAGNOSIS — Z1331 Encounter for screening for depression: Secondary | ICD-10-CM | POA: Diagnosis not present

## 2017-05-22 DIAGNOSIS — N183 Chronic kidney disease, stage 3 (moderate): Secondary | ICD-10-CM | POA: Diagnosis not present

## 2017-05-22 DIAGNOSIS — R600 Localized edema: Secondary | ICD-10-CM | POA: Diagnosis not present

## 2017-05-22 DIAGNOSIS — M6281 Muscle weakness (generalized): Secondary | ICD-10-CM | POA: Diagnosis not present

## 2017-05-22 DIAGNOSIS — J309 Allergic rhinitis, unspecified: Secondary | ICD-10-CM | POA: Diagnosis not present

## 2017-05-22 DIAGNOSIS — L0291 Cutaneous abscess, unspecified: Secondary | ICD-10-CM | POA: Diagnosis not present

## 2017-05-22 DIAGNOSIS — M159 Polyosteoarthritis, unspecified: Secondary | ICD-10-CM | POA: Diagnosis not present

## 2017-05-24 DIAGNOSIS — R2689 Other abnormalities of gait and mobility: Secondary | ICD-10-CM | POA: Diagnosis not present

## 2017-05-24 DIAGNOSIS — M6281 Muscle weakness (generalized): Secondary | ICD-10-CM | POA: Diagnosis not present

## 2017-05-24 DIAGNOSIS — R2681 Unsteadiness on feet: Secondary | ICD-10-CM | POA: Diagnosis not present

## 2017-05-29 DIAGNOSIS — L0291 Cutaneous abscess, unspecified: Secondary | ICD-10-CM | POA: Diagnosis not present

## 2017-05-29 DIAGNOSIS — R600 Localized edema: Secondary | ICD-10-CM | POA: Diagnosis not present

## 2017-05-29 DIAGNOSIS — I4891 Unspecified atrial fibrillation: Secondary | ICD-10-CM | POA: Diagnosis not present

## 2017-05-29 DIAGNOSIS — N183 Chronic kidney disease, stage 3 (moderate): Secondary | ICD-10-CM | POA: Diagnosis not present

## 2017-05-29 DIAGNOSIS — M6281 Muscle weakness (generalized): Secondary | ICD-10-CM | POA: Diagnosis not present

## 2017-05-29 DIAGNOSIS — K922 Gastrointestinal hemorrhage, unspecified: Secondary | ICD-10-CM | POA: Diagnosis not present

## 2017-05-29 DIAGNOSIS — R911 Solitary pulmonary nodule: Secondary | ICD-10-CM | POA: Diagnosis not present

## 2017-05-29 DIAGNOSIS — R2681 Unsteadiness on feet: Secondary | ICD-10-CM | POA: Diagnosis not present

## 2017-05-29 DIAGNOSIS — R2689 Other abnormalities of gait and mobility: Secondary | ICD-10-CM | POA: Diagnosis not present

## 2017-05-29 DIAGNOSIS — M159 Polyosteoarthritis, unspecified: Secondary | ICD-10-CM | POA: Diagnosis not present

## 2017-05-29 DIAGNOSIS — J309 Allergic rhinitis, unspecified: Secondary | ICD-10-CM | POA: Diagnosis not present

## 2017-05-29 DIAGNOSIS — J449 Chronic obstructive pulmonary disease, unspecified: Secondary | ICD-10-CM | POA: Diagnosis not present

## 2017-05-29 DIAGNOSIS — M109 Gout, unspecified: Secondary | ICD-10-CM | POA: Diagnosis not present

## 2017-05-31 DIAGNOSIS — R2689 Other abnormalities of gait and mobility: Secondary | ICD-10-CM | POA: Diagnosis not present

## 2017-05-31 DIAGNOSIS — R2681 Unsteadiness on feet: Secondary | ICD-10-CM | POA: Diagnosis not present

## 2017-05-31 DIAGNOSIS — M6281 Muscle weakness (generalized): Secondary | ICD-10-CM | POA: Diagnosis not present

## 2017-06-05 DIAGNOSIS — R2681 Unsteadiness on feet: Secondary | ICD-10-CM | POA: Diagnosis not present

## 2017-06-05 DIAGNOSIS — R2689 Other abnormalities of gait and mobility: Secondary | ICD-10-CM | POA: Diagnosis not present

## 2017-06-05 DIAGNOSIS — M6281 Muscle weakness (generalized): Secondary | ICD-10-CM | POA: Diagnosis not present

## 2017-06-07 DIAGNOSIS — M6281 Muscle weakness (generalized): Secondary | ICD-10-CM | POA: Diagnosis not present

## 2017-06-07 DIAGNOSIS — R2681 Unsteadiness on feet: Secondary | ICD-10-CM | POA: Diagnosis not present

## 2017-06-07 DIAGNOSIS — R2689 Other abnormalities of gait and mobility: Secondary | ICD-10-CM | POA: Diagnosis not present

## 2017-06-12 DIAGNOSIS — M6281 Muscle weakness (generalized): Secondary | ICD-10-CM | POA: Diagnosis not present

## 2017-06-12 DIAGNOSIS — R2681 Unsteadiness on feet: Secondary | ICD-10-CM | POA: Diagnosis not present

## 2017-06-12 DIAGNOSIS — R2689 Other abnormalities of gait and mobility: Secondary | ICD-10-CM | POA: Diagnosis not present

## 2017-06-14 DIAGNOSIS — R2681 Unsteadiness on feet: Secondary | ICD-10-CM | POA: Diagnosis not present

## 2017-06-14 DIAGNOSIS — M6281 Muscle weakness (generalized): Secondary | ICD-10-CM | POA: Diagnosis not present

## 2017-06-14 DIAGNOSIS — R2689 Other abnormalities of gait and mobility: Secondary | ICD-10-CM | POA: Diagnosis not present

## 2017-06-14 DIAGNOSIS — M48062 Spinal stenosis, lumbar region with neurogenic claudication: Secondary | ICD-10-CM | POA: Diagnosis not present

## 2017-06-14 DIAGNOSIS — R262 Difficulty in walking, not elsewhere classified: Secondary | ICD-10-CM | POA: Diagnosis not present

## 2017-06-19 DIAGNOSIS — R2681 Unsteadiness on feet: Secondary | ICD-10-CM | POA: Diagnosis not present

## 2017-06-19 DIAGNOSIS — R2689 Other abnormalities of gait and mobility: Secondary | ICD-10-CM | POA: Diagnosis not present

## 2017-06-19 DIAGNOSIS — M6281 Muscle weakness (generalized): Secondary | ICD-10-CM | POA: Diagnosis not present

## 2017-06-20 DIAGNOSIS — R2681 Unsteadiness on feet: Secondary | ICD-10-CM | POA: Diagnosis not present

## 2017-06-20 DIAGNOSIS — M6281 Muscle weakness (generalized): Secondary | ICD-10-CM | POA: Diagnosis not present

## 2017-06-20 DIAGNOSIS — R2689 Other abnormalities of gait and mobility: Secondary | ICD-10-CM | POA: Diagnosis not present

## 2017-06-26 DIAGNOSIS — K219 Gastro-esophageal reflux disease without esophagitis: Secondary | ICD-10-CM | POA: Diagnosis not present

## 2017-06-26 DIAGNOSIS — R2681 Unsteadiness on feet: Secondary | ICD-10-CM | POA: Diagnosis not present

## 2017-06-26 DIAGNOSIS — J9610 Chronic respiratory failure, unspecified whether with hypoxia or hypercapnia: Secondary | ICD-10-CM | POA: Diagnosis not present

## 2017-06-26 DIAGNOSIS — R2689 Other abnormalities of gait and mobility: Secondary | ICD-10-CM | POA: Diagnosis not present

## 2017-06-26 DIAGNOSIS — J309 Allergic rhinitis, unspecified: Secondary | ICD-10-CM | POA: Diagnosis not present

## 2017-06-26 DIAGNOSIS — I4891 Unspecified atrial fibrillation: Secondary | ICD-10-CM | POA: Diagnosis not present

## 2017-06-26 DIAGNOSIS — J439 Emphysema, unspecified: Secondary | ICD-10-CM | POA: Diagnosis not present

## 2017-06-26 DIAGNOSIS — I4892 Unspecified atrial flutter: Secondary | ICD-10-CM | POA: Diagnosis not present

## 2017-06-26 DIAGNOSIS — K08409 Partial loss of teeth, unspecified cause, unspecified class: Secondary | ICD-10-CM | POA: Diagnosis not present

## 2017-06-26 DIAGNOSIS — E039 Hypothyroidism, unspecified: Secondary | ICD-10-CM | POA: Diagnosis not present

## 2017-06-26 DIAGNOSIS — G8929 Other chronic pain: Secondary | ICD-10-CM | POA: Diagnosis not present

## 2017-06-26 DIAGNOSIS — M6281 Muscle weakness (generalized): Secondary | ICD-10-CM | POA: Diagnosis not present

## 2017-06-26 DIAGNOSIS — E785 Hyperlipidemia, unspecified: Secondary | ICD-10-CM | POA: Diagnosis not present

## 2017-06-27 DIAGNOSIS — R911 Solitary pulmonary nodule: Secondary | ICD-10-CM | POA: Diagnosis not present

## 2017-06-27 DIAGNOSIS — J449 Chronic obstructive pulmonary disease, unspecified: Secondary | ICD-10-CM | POA: Diagnosis not present

## 2017-06-27 DIAGNOSIS — I4891 Unspecified atrial fibrillation: Secondary | ICD-10-CM | POA: Diagnosis not present

## 2017-06-27 DIAGNOSIS — N159 Renal tubulo-interstitial disease, unspecified: Secondary | ICD-10-CM | POA: Diagnosis not present

## 2017-06-27 DIAGNOSIS — J309 Allergic rhinitis, unspecified: Secondary | ICD-10-CM | POA: Diagnosis not present

## 2017-06-27 DIAGNOSIS — R600 Localized edema: Secondary | ICD-10-CM | POA: Diagnosis not present

## 2017-06-27 DIAGNOSIS — M109 Gout, unspecified: Secondary | ICD-10-CM | POA: Diagnosis not present

## 2017-06-27 DIAGNOSIS — N183 Chronic kidney disease, stage 3 (moderate): Secondary | ICD-10-CM | POA: Diagnosis not present

## 2017-06-27 DIAGNOSIS — I10 Essential (primary) hypertension: Secondary | ICD-10-CM | POA: Diagnosis not present

## 2017-06-27 DIAGNOSIS — R634 Abnormal weight loss: Secondary | ICD-10-CM | POA: Diagnosis not present

## 2017-06-27 DIAGNOSIS — K922 Gastrointestinal hemorrhage, unspecified: Secondary | ICD-10-CM | POA: Diagnosis not present

## 2017-06-28 DIAGNOSIS — R2681 Unsteadiness on feet: Secondary | ICD-10-CM | POA: Diagnosis not present

## 2017-06-28 DIAGNOSIS — R2689 Other abnormalities of gait and mobility: Secondary | ICD-10-CM | POA: Diagnosis not present

## 2017-06-28 DIAGNOSIS — M6281 Muscle weakness (generalized): Secondary | ICD-10-CM | POA: Diagnosis not present

## 2017-07-03 DIAGNOSIS — M6281 Muscle weakness (generalized): Secondary | ICD-10-CM | POA: Diagnosis not present

## 2017-07-03 DIAGNOSIS — R2689 Other abnormalities of gait and mobility: Secondary | ICD-10-CM | POA: Diagnosis not present

## 2017-07-03 DIAGNOSIS — R2681 Unsteadiness on feet: Secondary | ICD-10-CM | POA: Diagnosis not present

## 2017-07-05 DIAGNOSIS — M6281 Muscle weakness (generalized): Secondary | ICD-10-CM | POA: Diagnosis not present

## 2017-07-05 DIAGNOSIS — R2681 Unsteadiness on feet: Secondary | ICD-10-CM | POA: Diagnosis not present

## 2017-07-05 DIAGNOSIS — R2689 Other abnormalities of gait and mobility: Secondary | ICD-10-CM | POA: Diagnosis not present

## 2017-07-10 DIAGNOSIS — R2689 Other abnormalities of gait and mobility: Secondary | ICD-10-CM | POA: Diagnosis not present

## 2017-07-10 DIAGNOSIS — M6281 Muscle weakness (generalized): Secondary | ICD-10-CM | POA: Diagnosis not present

## 2017-07-10 DIAGNOSIS — R2681 Unsteadiness on feet: Secondary | ICD-10-CM | POA: Diagnosis not present

## 2017-07-12 DIAGNOSIS — R2681 Unsteadiness on feet: Secondary | ICD-10-CM | POA: Diagnosis not present

## 2017-07-12 DIAGNOSIS — M6281 Muscle weakness (generalized): Secondary | ICD-10-CM | POA: Diagnosis not present

## 2017-07-12 DIAGNOSIS — R2689 Other abnormalities of gait and mobility: Secondary | ICD-10-CM | POA: Diagnosis not present

## 2017-07-15 DIAGNOSIS — R262 Difficulty in walking, not elsewhere classified: Secondary | ICD-10-CM | POA: Diagnosis not present

## 2017-07-15 DIAGNOSIS — M48062 Spinal stenosis, lumbar region with neurogenic claudication: Secondary | ICD-10-CM | POA: Diagnosis not present

## 2017-07-17 DIAGNOSIS — R2689 Other abnormalities of gait and mobility: Secondary | ICD-10-CM | POA: Diagnosis not present

## 2017-07-17 DIAGNOSIS — M6281 Muscle weakness (generalized): Secondary | ICD-10-CM | POA: Diagnosis not present

## 2017-07-17 DIAGNOSIS — R2681 Unsteadiness on feet: Secondary | ICD-10-CM | POA: Diagnosis not present

## 2017-07-24 DIAGNOSIS — R2681 Unsteadiness on feet: Secondary | ICD-10-CM | POA: Diagnosis not present

## 2017-07-24 DIAGNOSIS — R2689 Other abnormalities of gait and mobility: Secondary | ICD-10-CM | POA: Diagnosis not present

## 2017-07-24 DIAGNOSIS — M6281 Muscle weakness (generalized): Secondary | ICD-10-CM | POA: Diagnosis not present

## 2017-07-26 DIAGNOSIS — R2681 Unsteadiness on feet: Secondary | ICD-10-CM | POA: Diagnosis not present

## 2017-07-26 DIAGNOSIS — M6281 Muscle weakness (generalized): Secondary | ICD-10-CM | POA: Diagnosis not present

## 2017-07-26 DIAGNOSIS — R2689 Other abnormalities of gait and mobility: Secondary | ICD-10-CM | POA: Diagnosis not present

## 2017-07-31 DIAGNOSIS — R2689 Other abnormalities of gait and mobility: Secondary | ICD-10-CM | POA: Diagnosis not present

## 2017-07-31 DIAGNOSIS — R2681 Unsteadiness on feet: Secondary | ICD-10-CM | POA: Diagnosis not present

## 2017-07-31 DIAGNOSIS — M6281 Muscle weakness (generalized): Secondary | ICD-10-CM | POA: Diagnosis not present

## 2017-08-14 DIAGNOSIS — M4726 Other spondylosis with radiculopathy, lumbar region: Secondary | ICD-10-CM | POA: Diagnosis not present

## 2017-08-14 DIAGNOSIS — R262 Difficulty in walking, not elsewhere classified: Secondary | ICD-10-CM | POA: Diagnosis not present

## 2017-08-14 DIAGNOSIS — M5431 Sciatica, right side: Secondary | ICD-10-CM | POA: Diagnosis not present

## 2017-08-14 DIAGNOSIS — M48062 Spinal stenosis, lumbar region with neurogenic claudication: Secondary | ICD-10-CM | POA: Diagnosis not present

## 2017-08-14 DIAGNOSIS — M5432 Sciatica, left side: Secondary | ICD-10-CM | POA: Diagnosis not present

## 2017-08-21 DIAGNOSIS — M25662 Stiffness of left knee, not elsewhere classified: Secondary | ICD-10-CM | POA: Diagnosis not present

## 2017-08-21 DIAGNOSIS — M6281 Muscle weakness (generalized): Secondary | ICD-10-CM | POA: Diagnosis not present

## 2017-08-21 DIAGNOSIS — M25661 Stiffness of right knee, not elsewhere classified: Secondary | ICD-10-CM | POA: Diagnosis not present

## 2017-08-21 DIAGNOSIS — R2689 Other abnormalities of gait and mobility: Secondary | ICD-10-CM | POA: Diagnosis not present

## 2017-08-21 DIAGNOSIS — M4726 Other spondylosis with radiculopathy, lumbar region: Secondary | ICD-10-CM | POA: Diagnosis not present

## 2017-08-21 DIAGNOSIS — M25651 Stiffness of right hip, not elsewhere classified: Secondary | ICD-10-CM | POA: Diagnosis not present

## 2017-08-21 DIAGNOSIS — M25652 Stiffness of left hip, not elsewhere classified: Secondary | ICD-10-CM | POA: Diagnosis not present

## 2017-08-29 DIAGNOSIS — J309 Allergic rhinitis, unspecified: Secondary | ICD-10-CM | POA: Diagnosis not present

## 2017-08-29 DIAGNOSIS — M4726 Other spondylosis with radiculopathy, lumbar region: Secondary | ICD-10-CM | POA: Diagnosis not present

## 2017-08-29 DIAGNOSIS — M159 Polyosteoarthritis, unspecified: Secondary | ICD-10-CM | POA: Diagnosis not present

## 2017-08-29 DIAGNOSIS — M25652 Stiffness of left hip, not elsewhere classified: Secondary | ICD-10-CM | POA: Diagnosis not present

## 2017-08-29 DIAGNOSIS — M109 Gout, unspecified: Secondary | ICD-10-CM | POA: Diagnosis not present

## 2017-08-29 DIAGNOSIS — K922 Gastrointestinal hemorrhage, unspecified: Secondary | ICD-10-CM | POA: Diagnosis not present

## 2017-08-29 DIAGNOSIS — I4891 Unspecified atrial fibrillation: Secondary | ICD-10-CM | POA: Diagnosis not present

## 2017-08-29 DIAGNOSIS — R911 Solitary pulmonary nodule: Secondary | ICD-10-CM | POA: Diagnosis not present

## 2017-08-29 DIAGNOSIS — M6281 Muscle weakness (generalized): Secondary | ICD-10-CM | POA: Diagnosis not present

## 2017-08-29 DIAGNOSIS — M25651 Stiffness of right hip, not elsewhere classified: Secondary | ICD-10-CM | POA: Diagnosis not present

## 2017-08-29 DIAGNOSIS — M25662 Stiffness of left knee, not elsewhere classified: Secondary | ICD-10-CM | POA: Diagnosis not present

## 2017-08-29 DIAGNOSIS — R2689 Other abnormalities of gait and mobility: Secondary | ICD-10-CM | POA: Diagnosis not present

## 2017-08-29 DIAGNOSIS — R634 Abnormal weight loss: Secondary | ICD-10-CM | POA: Diagnosis not present

## 2017-08-29 DIAGNOSIS — R600 Localized edema: Secondary | ICD-10-CM | POA: Diagnosis not present

## 2017-08-29 DIAGNOSIS — M25661 Stiffness of right knee, not elsewhere classified: Secondary | ICD-10-CM | POA: Diagnosis not present

## 2017-08-29 DIAGNOSIS — N183 Chronic kidney disease, stage 3 (moderate): Secondary | ICD-10-CM | POA: Diagnosis not present

## 2017-08-29 DIAGNOSIS — J449 Chronic obstructive pulmonary disease, unspecified: Secondary | ICD-10-CM | POA: Diagnosis not present

## 2017-09-05 DIAGNOSIS — M6281 Muscle weakness (generalized): Secondary | ICD-10-CM | POA: Diagnosis not present

## 2017-09-05 DIAGNOSIS — M25661 Stiffness of right knee, not elsewhere classified: Secondary | ICD-10-CM | POA: Diagnosis not present

## 2017-09-05 DIAGNOSIS — R2689 Other abnormalities of gait and mobility: Secondary | ICD-10-CM | POA: Diagnosis not present

## 2017-09-05 DIAGNOSIS — M4726 Other spondylosis with radiculopathy, lumbar region: Secondary | ICD-10-CM | POA: Diagnosis not present

## 2017-09-05 DIAGNOSIS — M25651 Stiffness of right hip, not elsewhere classified: Secondary | ICD-10-CM | POA: Diagnosis not present

## 2017-09-05 DIAGNOSIS — M25652 Stiffness of left hip, not elsewhere classified: Secondary | ICD-10-CM | POA: Diagnosis not present

## 2017-09-05 DIAGNOSIS — M25662 Stiffness of left knee, not elsewhere classified: Secondary | ICD-10-CM | POA: Diagnosis not present

## 2017-09-10 DIAGNOSIS — M25651 Stiffness of right hip, not elsewhere classified: Secondary | ICD-10-CM | POA: Diagnosis not present

## 2017-09-10 DIAGNOSIS — M25662 Stiffness of left knee, not elsewhere classified: Secondary | ICD-10-CM | POA: Diagnosis not present

## 2017-09-10 DIAGNOSIS — M4726 Other spondylosis with radiculopathy, lumbar region: Secondary | ICD-10-CM | POA: Diagnosis not present

## 2017-09-10 DIAGNOSIS — M25652 Stiffness of left hip, not elsewhere classified: Secondary | ICD-10-CM | POA: Diagnosis not present

## 2017-09-10 DIAGNOSIS — M6281 Muscle weakness (generalized): Secondary | ICD-10-CM | POA: Diagnosis not present

## 2017-09-10 DIAGNOSIS — M25661 Stiffness of right knee, not elsewhere classified: Secondary | ICD-10-CM | POA: Diagnosis not present

## 2017-09-10 DIAGNOSIS — R2689 Other abnormalities of gait and mobility: Secondary | ICD-10-CM | POA: Diagnosis not present

## 2017-09-12 DIAGNOSIS — M6281 Muscle weakness (generalized): Secondary | ICD-10-CM | POA: Diagnosis not present

## 2017-09-12 DIAGNOSIS — M25652 Stiffness of left hip, not elsewhere classified: Secondary | ICD-10-CM | POA: Diagnosis not present

## 2017-09-12 DIAGNOSIS — M4726 Other spondylosis with radiculopathy, lumbar region: Secondary | ICD-10-CM | POA: Diagnosis not present

## 2017-09-12 DIAGNOSIS — R2689 Other abnormalities of gait and mobility: Secondary | ICD-10-CM | POA: Diagnosis not present

## 2017-09-12 DIAGNOSIS — M25661 Stiffness of right knee, not elsewhere classified: Secondary | ICD-10-CM | POA: Diagnosis not present

## 2017-09-12 DIAGNOSIS — M25651 Stiffness of right hip, not elsewhere classified: Secondary | ICD-10-CM | POA: Diagnosis not present

## 2017-09-12 DIAGNOSIS — M25662 Stiffness of left knee, not elsewhere classified: Secondary | ICD-10-CM | POA: Diagnosis not present

## 2017-09-14 DIAGNOSIS — M25652 Stiffness of left hip, not elsewhere classified: Secondary | ICD-10-CM | POA: Diagnosis not present

## 2017-09-14 DIAGNOSIS — M48062 Spinal stenosis, lumbar region with neurogenic claudication: Secondary | ICD-10-CM | POA: Diagnosis not present

## 2017-09-14 DIAGNOSIS — R2689 Other abnormalities of gait and mobility: Secondary | ICD-10-CM | POA: Diagnosis not present

## 2017-09-14 DIAGNOSIS — M6281 Muscle weakness (generalized): Secondary | ICD-10-CM | POA: Diagnosis not present

## 2017-09-14 DIAGNOSIS — M25662 Stiffness of left knee, not elsewhere classified: Secondary | ICD-10-CM | POA: Diagnosis not present

## 2017-09-14 DIAGNOSIS — M25661 Stiffness of right knee, not elsewhere classified: Secondary | ICD-10-CM | POA: Diagnosis not present

## 2017-09-14 DIAGNOSIS — M4726 Other spondylosis with radiculopathy, lumbar region: Secondary | ICD-10-CM | POA: Diagnosis not present

## 2017-09-14 DIAGNOSIS — M25651 Stiffness of right hip, not elsewhere classified: Secondary | ICD-10-CM | POA: Diagnosis not present

## 2017-09-14 DIAGNOSIS — R262 Difficulty in walking, not elsewhere classified: Secondary | ICD-10-CM | POA: Diagnosis not present

## 2017-09-17 DIAGNOSIS — M25652 Stiffness of left hip, not elsewhere classified: Secondary | ICD-10-CM | POA: Diagnosis not present

## 2017-09-17 DIAGNOSIS — M4726 Other spondylosis with radiculopathy, lumbar region: Secondary | ICD-10-CM | POA: Diagnosis not present

## 2017-09-17 DIAGNOSIS — M25651 Stiffness of right hip, not elsewhere classified: Secondary | ICD-10-CM | POA: Diagnosis not present

## 2017-09-17 DIAGNOSIS — M25662 Stiffness of left knee, not elsewhere classified: Secondary | ICD-10-CM | POA: Diagnosis not present

## 2017-09-17 DIAGNOSIS — M25661 Stiffness of right knee, not elsewhere classified: Secondary | ICD-10-CM | POA: Diagnosis not present

## 2017-09-17 DIAGNOSIS — M6281 Muscle weakness (generalized): Secondary | ICD-10-CM | POA: Diagnosis not present

## 2017-09-17 DIAGNOSIS — R2689 Other abnormalities of gait and mobility: Secondary | ICD-10-CM | POA: Diagnosis not present

## 2017-09-19 DIAGNOSIS — M4726 Other spondylosis with radiculopathy, lumbar region: Secondary | ICD-10-CM | POA: Diagnosis not present

## 2017-09-19 DIAGNOSIS — M25661 Stiffness of right knee, not elsewhere classified: Secondary | ICD-10-CM | POA: Diagnosis not present

## 2017-09-19 DIAGNOSIS — M25662 Stiffness of left knee, not elsewhere classified: Secondary | ICD-10-CM | POA: Diagnosis not present

## 2017-09-19 DIAGNOSIS — R2689 Other abnormalities of gait and mobility: Secondary | ICD-10-CM | POA: Diagnosis not present

## 2017-09-19 DIAGNOSIS — M6281 Muscle weakness (generalized): Secondary | ICD-10-CM | POA: Diagnosis not present

## 2017-09-19 DIAGNOSIS — M25651 Stiffness of right hip, not elsewhere classified: Secondary | ICD-10-CM | POA: Diagnosis not present

## 2017-09-19 DIAGNOSIS — M25652 Stiffness of left hip, not elsewhere classified: Secondary | ICD-10-CM | POA: Diagnosis not present

## 2017-09-21 DIAGNOSIS — M4726 Other spondylosis with radiculopathy, lumbar region: Secondary | ICD-10-CM | POA: Diagnosis not present

## 2017-09-21 DIAGNOSIS — M6281 Muscle weakness (generalized): Secondary | ICD-10-CM | POA: Diagnosis not present

## 2017-09-21 DIAGNOSIS — M25662 Stiffness of left knee, not elsewhere classified: Secondary | ICD-10-CM | POA: Diagnosis not present

## 2017-09-21 DIAGNOSIS — R2689 Other abnormalities of gait and mobility: Secondary | ICD-10-CM | POA: Diagnosis not present

## 2017-09-21 DIAGNOSIS — M25661 Stiffness of right knee, not elsewhere classified: Secondary | ICD-10-CM | POA: Diagnosis not present

## 2017-09-21 DIAGNOSIS — M25652 Stiffness of left hip, not elsewhere classified: Secondary | ICD-10-CM | POA: Diagnosis not present

## 2017-09-21 DIAGNOSIS — M25651 Stiffness of right hip, not elsewhere classified: Secondary | ICD-10-CM | POA: Diagnosis not present

## 2017-09-24 DIAGNOSIS — M6281 Muscle weakness (generalized): Secondary | ICD-10-CM | POA: Diagnosis not present

## 2017-09-24 DIAGNOSIS — M25652 Stiffness of left hip, not elsewhere classified: Secondary | ICD-10-CM | POA: Diagnosis not present

## 2017-09-24 DIAGNOSIS — M4726 Other spondylosis with radiculopathy, lumbar region: Secondary | ICD-10-CM | POA: Diagnosis not present

## 2017-09-24 DIAGNOSIS — M25651 Stiffness of right hip, not elsewhere classified: Secondary | ICD-10-CM | POA: Diagnosis not present

## 2017-09-24 DIAGNOSIS — R2689 Other abnormalities of gait and mobility: Secondary | ICD-10-CM | POA: Diagnosis not present

## 2017-09-24 DIAGNOSIS — M25662 Stiffness of left knee, not elsewhere classified: Secondary | ICD-10-CM | POA: Diagnosis not present

## 2017-09-24 DIAGNOSIS — M25661 Stiffness of right knee, not elsewhere classified: Secondary | ICD-10-CM | POA: Diagnosis not present

## 2017-09-26 DIAGNOSIS — M25661 Stiffness of right knee, not elsewhere classified: Secondary | ICD-10-CM | POA: Diagnosis not present

## 2017-09-26 DIAGNOSIS — M25662 Stiffness of left knee, not elsewhere classified: Secondary | ICD-10-CM | POA: Diagnosis not present

## 2017-09-26 DIAGNOSIS — M25651 Stiffness of right hip, not elsewhere classified: Secondary | ICD-10-CM | POA: Diagnosis not present

## 2017-09-26 DIAGNOSIS — M4726 Other spondylosis with radiculopathy, lumbar region: Secondary | ICD-10-CM | POA: Diagnosis not present

## 2017-09-26 DIAGNOSIS — M6281 Muscle weakness (generalized): Secondary | ICD-10-CM | POA: Diagnosis not present

## 2017-09-26 DIAGNOSIS — M25652 Stiffness of left hip, not elsewhere classified: Secondary | ICD-10-CM | POA: Diagnosis not present

## 2017-09-26 DIAGNOSIS — R2689 Other abnormalities of gait and mobility: Secondary | ICD-10-CM | POA: Diagnosis not present

## 2017-09-28 DIAGNOSIS — M25662 Stiffness of left knee, not elsewhere classified: Secondary | ICD-10-CM | POA: Diagnosis not present

## 2017-09-28 DIAGNOSIS — M25651 Stiffness of right hip, not elsewhere classified: Secondary | ICD-10-CM | POA: Diagnosis not present

## 2017-09-28 DIAGNOSIS — M4726 Other spondylosis with radiculopathy, lumbar region: Secondary | ICD-10-CM | POA: Diagnosis not present

## 2017-09-28 DIAGNOSIS — M25652 Stiffness of left hip, not elsewhere classified: Secondary | ICD-10-CM | POA: Diagnosis not present

## 2017-09-28 DIAGNOSIS — M6281 Muscle weakness (generalized): Secondary | ICD-10-CM | POA: Diagnosis not present

## 2017-09-28 DIAGNOSIS — M25661 Stiffness of right knee, not elsewhere classified: Secondary | ICD-10-CM | POA: Diagnosis not present

## 2017-09-28 DIAGNOSIS — R2689 Other abnormalities of gait and mobility: Secondary | ICD-10-CM | POA: Diagnosis not present

## 2017-10-01 DIAGNOSIS — M25652 Stiffness of left hip, not elsewhere classified: Secondary | ICD-10-CM | POA: Diagnosis not present

## 2017-10-01 DIAGNOSIS — M25661 Stiffness of right knee, not elsewhere classified: Secondary | ICD-10-CM | POA: Diagnosis not present

## 2017-10-01 DIAGNOSIS — I480 Paroxysmal atrial fibrillation: Secondary | ICD-10-CM | POA: Diagnosis not present

## 2017-10-01 DIAGNOSIS — Z7901 Long term (current) use of anticoagulants: Secondary | ICD-10-CM | POA: Diagnosis not present

## 2017-10-01 DIAGNOSIS — M25651 Stiffness of right hip, not elsewhere classified: Secondary | ICD-10-CM | POA: Diagnosis not present

## 2017-10-01 DIAGNOSIS — M6281 Muscle weakness (generalized): Secondary | ICD-10-CM | POA: Diagnosis not present

## 2017-10-01 DIAGNOSIS — M4726 Other spondylosis with radiculopathy, lumbar region: Secondary | ICD-10-CM | POA: Diagnosis not present

## 2017-10-01 DIAGNOSIS — R2689 Other abnormalities of gait and mobility: Secondary | ICD-10-CM | POA: Diagnosis not present

## 2017-10-01 DIAGNOSIS — M25662 Stiffness of left knee, not elsewhere classified: Secondary | ICD-10-CM | POA: Diagnosis not present

## 2017-10-03 DIAGNOSIS — M21372 Foot drop, left foot: Secondary | ICD-10-CM | POA: Diagnosis not present

## 2017-10-03 DIAGNOSIS — M25652 Stiffness of left hip, not elsewhere classified: Secondary | ICD-10-CM | POA: Diagnosis not present

## 2017-10-03 DIAGNOSIS — M4726 Other spondylosis with radiculopathy, lumbar region: Secondary | ICD-10-CM | POA: Diagnosis not present

## 2017-10-03 DIAGNOSIS — M6281 Muscle weakness (generalized): Secondary | ICD-10-CM | POA: Diagnosis not present

## 2017-10-03 DIAGNOSIS — M2352 Chronic instability of knee, left knee: Secondary | ICD-10-CM | POA: Diagnosis not present

## 2017-10-03 DIAGNOSIS — M25662 Stiffness of left knee, not elsewhere classified: Secondary | ICD-10-CM | POA: Diagnosis not present

## 2017-10-03 DIAGNOSIS — M25661 Stiffness of right knee, not elsewhere classified: Secondary | ICD-10-CM | POA: Diagnosis not present

## 2017-10-03 DIAGNOSIS — M2351 Chronic instability of knee, right knee: Secondary | ICD-10-CM | POA: Diagnosis not present

## 2017-10-03 DIAGNOSIS — R2689 Other abnormalities of gait and mobility: Secondary | ICD-10-CM | POA: Diagnosis not present

## 2017-10-03 DIAGNOSIS — M25371 Other instability, right ankle: Secondary | ICD-10-CM | POA: Diagnosis not present

## 2017-10-03 DIAGNOSIS — M25651 Stiffness of right hip, not elsewhere classified: Secondary | ICD-10-CM | POA: Diagnosis not present

## 2017-10-05 DIAGNOSIS — M25651 Stiffness of right hip, not elsewhere classified: Secondary | ICD-10-CM | POA: Diagnosis not present

## 2017-10-05 DIAGNOSIS — M6281 Muscle weakness (generalized): Secondary | ICD-10-CM | POA: Diagnosis not present

## 2017-10-05 DIAGNOSIS — M25661 Stiffness of right knee, not elsewhere classified: Secondary | ICD-10-CM | POA: Diagnosis not present

## 2017-10-05 DIAGNOSIS — M25662 Stiffness of left knee, not elsewhere classified: Secondary | ICD-10-CM | POA: Diagnosis not present

## 2017-10-05 DIAGNOSIS — M25652 Stiffness of left hip, not elsewhere classified: Secondary | ICD-10-CM | POA: Diagnosis not present

## 2017-10-05 DIAGNOSIS — R2689 Other abnormalities of gait and mobility: Secondary | ICD-10-CM | POA: Diagnosis not present

## 2017-10-05 DIAGNOSIS — M4726 Other spondylosis with radiculopathy, lumbar region: Secondary | ICD-10-CM | POA: Diagnosis not present

## 2017-10-10 DIAGNOSIS — M4726 Other spondylosis with radiculopathy, lumbar region: Secondary | ICD-10-CM | POA: Diagnosis not present

## 2017-10-10 DIAGNOSIS — M25652 Stiffness of left hip, not elsewhere classified: Secondary | ICD-10-CM | POA: Diagnosis not present

## 2017-10-10 DIAGNOSIS — R2689 Other abnormalities of gait and mobility: Secondary | ICD-10-CM | POA: Diagnosis not present

## 2017-10-10 DIAGNOSIS — M25662 Stiffness of left knee, not elsewhere classified: Secondary | ICD-10-CM | POA: Diagnosis not present

## 2017-10-10 DIAGNOSIS — M25661 Stiffness of right knee, not elsewhere classified: Secondary | ICD-10-CM | POA: Diagnosis not present

## 2017-10-10 DIAGNOSIS — M6281 Muscle weakness (generalized): Secondary | ICD-10-CM | POA: Diagnosis not present

## 2017-10-10 DIAGNOSIS — M25651 Stiffness of right hip, not elsewhere classified: Secondary | ICD-10-CM | POA: Diagnosis not present

## 2017-10-15 DIAGNOSIS — R262 Difficulty in walking, not elsewhere classified: Secondary | ICD-10-CM | POA: Diagnosis not present

## 2017-10-15 DIAGNOSIS — M48062 Spinal stenosis, lumbar region with neurogenic claudication: Secondary | ICD-10-CM | POA: Diagnosis not present

## 2017-10-16 IMAGING — XA DG MYELOGRAPHY LUMBAR INJ LUMBOSACRAL
8 series · 8 of 8 positions shown · non-contrast
Comparison: CT of the lumbar spine 04/04/2016

CLINICAL DATA: Low back pain extending into the lower extremities
bilaterally.
TECHNIQUE: Contiguous axial images were obtained through the Lumbar spine after
the intrathecal infusion of infusion. Coronal and sagittal
reconstructions were obtained of the axial image sets.

[Series 1: ortho standard · 1 of 1 slices shown (1 of 8)]
[im 1/1]
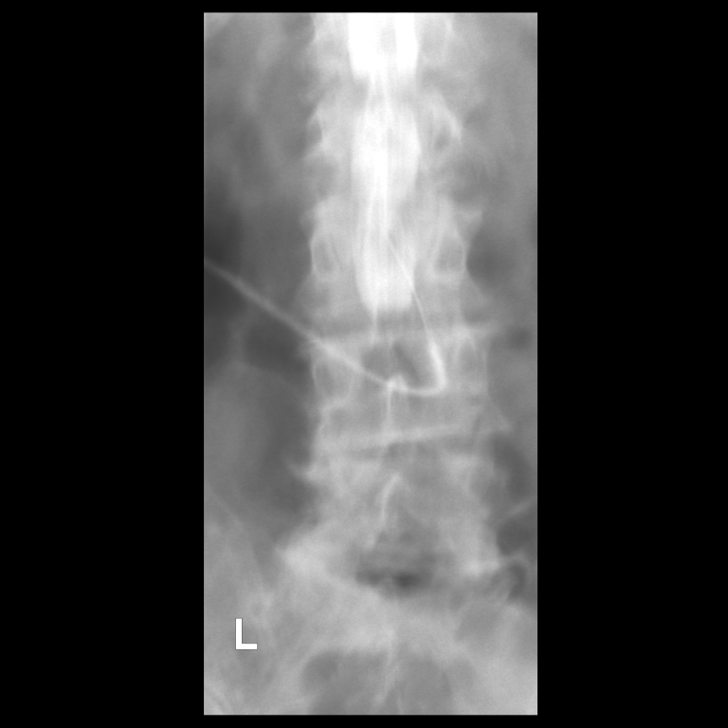

[Series 2: ortho standard · 1 of 1 slices shown (2 of 8)]
[im 1/1]
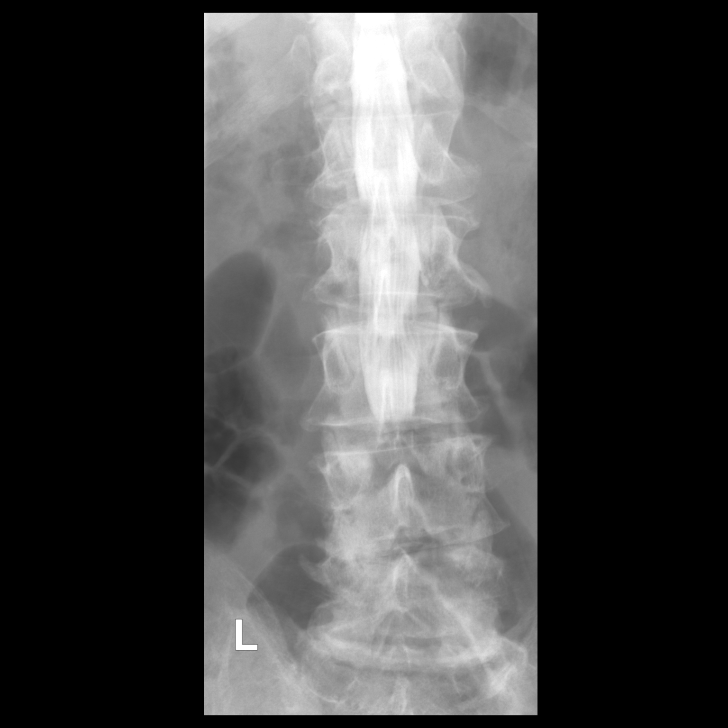

[Series 3: ortho standard · 1 of 1 slices shown (3 of 8)]
[im 1/1]
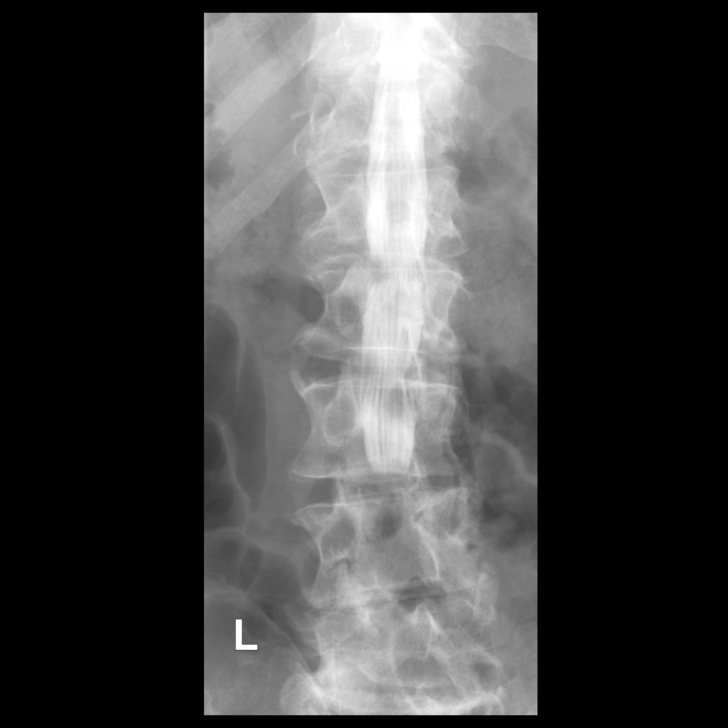

[Series 4: ortho standard · 1 of 1 slices shown (4 of 8)]
[im 1/1]
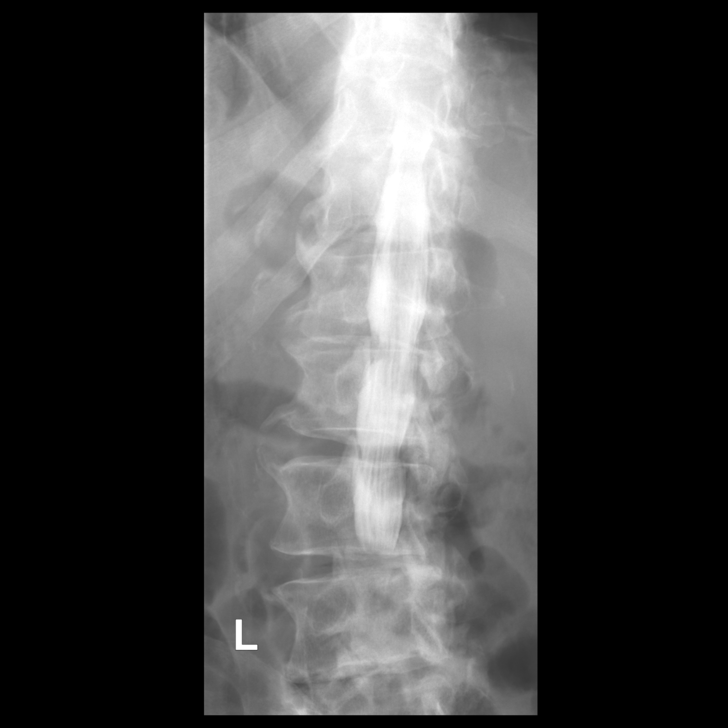

[Series 5: ortho standard · 1 of 1 slices shown (5 of 8)]
[im 1/1]
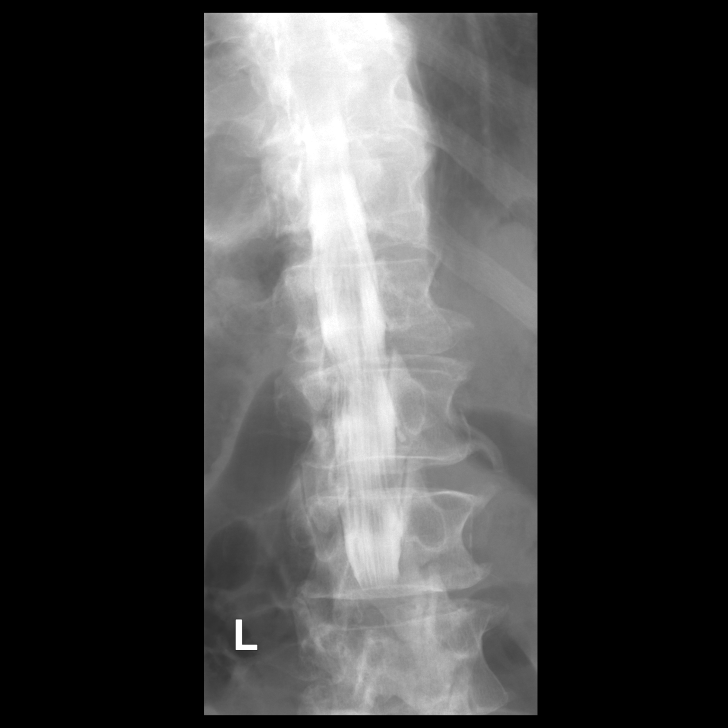

[Series 6: ortho standard · 1 of 1 slices shown (6 of 8)]
[im 1/1]
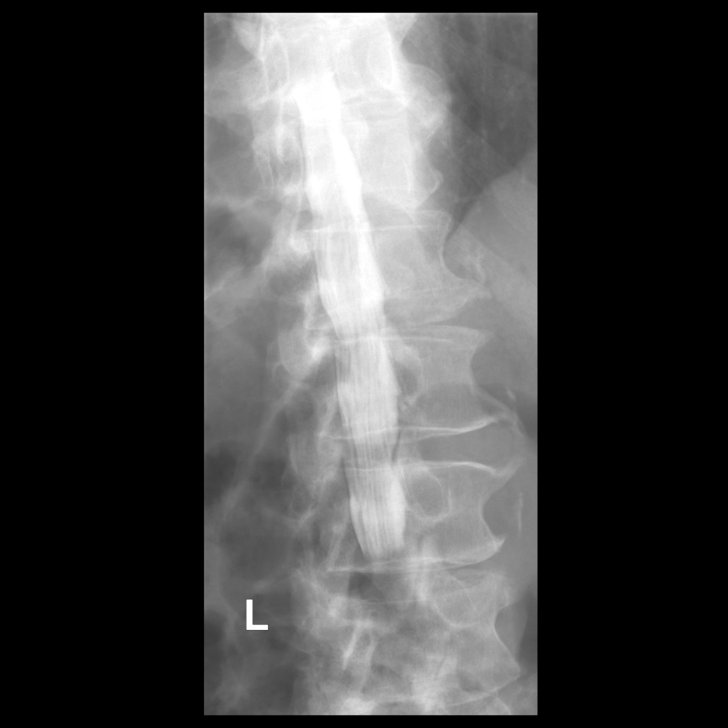

[Series 7: ortho standard · 1 of 1 slices shown (7 of 8)]
[im 1/1]
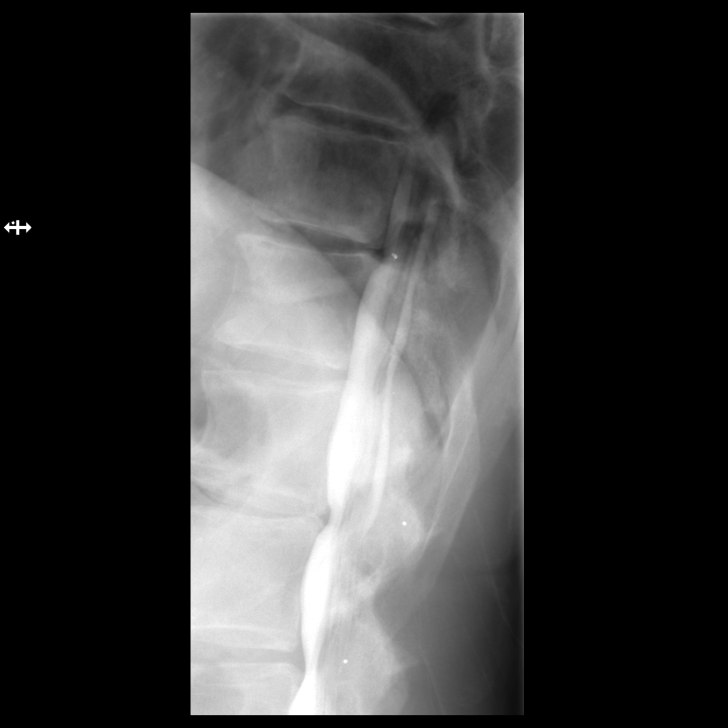

[Series 8: ortho standard · 1 of 1 slices shown (8 of 8)]
[im 1/1]
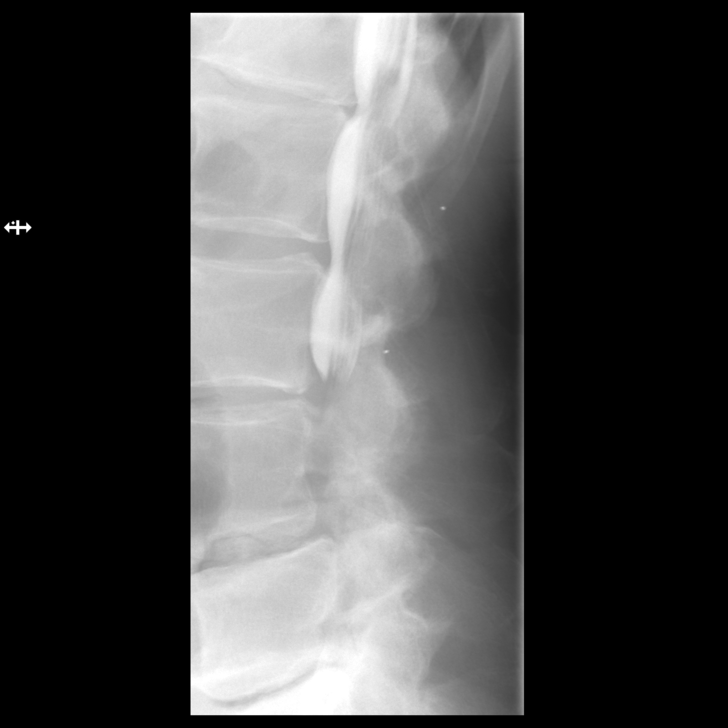

[8 of 8 positions shown; findings below may reference images not displayed]

EXAM:
LUMBAR MYELOGRAM

FLUOROSCOPY TIME:  Radiation Exposure Index (as provided by the
fluoroscopic device): 142.37 uGy*m2

Fluoroscopy Time:  25 seconds

Number of Acquired Images:  10

PROCEDURE:
After thorough discussion of risks and benefits of the procedure
including bleeding, infection, injury to nerves, blood vessels,
adjacent structures as well as headache and CSF leak, written and
oral informed consent was obtained. Consent was obtained by Dr.
Blondinacka Samsonaite. Time out form was completed.

Patient was positioned prone on the fluoroscopy table. Local
anesthesia was provided with 1% lidocaine without epinephrine after
prepped and draped in the usual sterile fashion. Puncture was
performed at L2-3 using a 3 1/2 inch 22-gauge spinal needle via a
right paramedian approach. Using a single pass through the dura, the
needle was placed within the thecal sac, with return of clear CSF.
15 mL of Isovue M 200 was injected into the thecal sac, with normal
opacification of the nerve roots and cauda equina consistent with
free flow within the subarachnoid space.

I personally performed the lumbar puncture and administered the
intrathecal contrast. I also personally supervised acquisition of
the myelogram images.
FINDINGS: LUMBAR MYELOGRAM FINDINGS:

The myelographic images demonstrated block of contrast at the L3-4
level. Asymmetric endplate changes are present at L4-5 on the left.
Subarticular narrowing is present bilaterally at L2-3, worse on the
right. Subarticular narrowing at L1-2 is worse on the left. There is
early truncation of the left L1 and L2 nerve roots. The right L1 and
L2 nerve roots fill normally.

CT LUMBAR MYELOGRAM FINDINGS:

The lumbar spine is imaged from the midbody of T12 through S2-3.
Conus medullaris terminates at L1-2, within normal limits. Grade 1
anterolisthesis at L4-5 measures 5 mm. Asymmetric endplate changes
are present on the right at L3-4 with Schmorl's nodes in the
superior endplate of L4. Asymmetric endplate sclerosis is present on
the left at L4-5. There is diffuse endplate change at L5-S1.

Atherosclerotic calcifications are present in the aorta without
aneurysm. No discrete solid organ lesions are present. There is no
significant adenopathy.

L1-2: A broad-based disc protrusion is present. Mild facet
hypertrophy is noted bilaterally. Moderate left and mild right
subarticular narrowing is present. Mild foraminal narrowing is noted
bilaterally.

L2-3: A broad-based disc protrusion is present. Mild facet
hypertrophy and spurring is noted. Moderate right and mild left
subarticular narrowing is present. Mild foraminal narrowing is
present on the right.

L3-4: A broad-based disc protrusion is present. Moderate facet
hypertrophy and extensive ligamentum flavum thickening is noted
bilaterally. This results in severe central canal stenosis with
crowding of the nerve roots. Severe right and moderate left
foraminal stenosis is present.

L4-5: Advanced facet hypertrophy is present with spurring
bilaterally. Anterolisthesis results in uncovering of the lumbar
disc at L4-5. Severe central canal stenosis present. Severe left and
moderate right foraminal narrowing is present.

L5-S1: Well a broad-based disc protrusion is present. Moderate facet
hypertrophy is noted. Moderate subarticular narrowing is present
bilaterally. Severe foraminal stenosis is evident bilaterally. Disc
material and gas extending to the right foramen, communicating from
the disc.
IMPRESSION: 1. Severe central canal stenosis at L3-4 and L4-5 with block
myelographic contrast on the conventional myelographic images.
Contrast is seen lower than the L3-4 levels on CT.
2. Moderate left and mild right subarticular narrowing at L1-2.
3. Moderate right and mild left subarticular narrowing at L2-3.
4. Mild foraminal and narrowing bilaterally at L1-2 and L2-3.
5. Severe right and moderate left foraminal narrowing at L3-4.
6. Severe left and moderate right foraminal narrowing at L4-5.
7. Moderate subarticular and severe foraminal stenosis bilaterally
at L5-S1.

## 2017-10-17 DIAGNOSIS — M25661 Stiffness of right knee, not elsewhere classified: Secondary | ICD-10-CM | POA: Diagnosis not present

## 2017-10-17 DIAGNOSIS — R2689 Other abnormalities of gait and mobility: Secondary | ICD-10-CM | POA: Diagnosis not present

## 2017-10-17 DIAGNOSIS — M25652 Stiffness of left hip, not elsewhere classified: Secondary | ICD-10-CM | POA: Diagnosis not present

## 2017-10-17 DIAGNOSIS — M25651 Stiffness of right hip, not elsewhere classified: Secondary | ICD-10-CM | POA: Diagnosis not present

## 2017-10-17 DIAGNOSIS — M25662 Stiffness of left knee, not elsewhere classified: Secondary | ICD-10-CM | POA: Diagnosis not present

## 2017-10-17 DIAGNOSIS — M6281 Muscle weakness (generalized): Secondary | ICD-10-CM | POA: Diagnosis not present

## 2017-10-17 DIAGNOSIS — M4726 Other spondylosis with radiculopathy, lumbar region: Secondary | ICD-10-CM | POA: Diagnosis not present

## 2017-10-19 DIAGNOSIS — R2689 Other abnormalities of gait and mobility: Secondary | ICD-10-CM | POA: Diagnosis not present

## 2017-10-19 DIAGNOSIS — M4726 Other spondylosis with radiculopathy, lumbar region: Secondary | ICD-10-CM | POA: Diagnosis not present

## 2017-10-19 DIAGNOSIS — M25662 Stiffness of left knee, not elsewhere classified: Secondary | ICD-10-CM | POA: Diagnosis not present

## 2017-10-19 DIAGNOSIS — M25652 Stiffness of left hip, not elsewhere classified: Secondary | ICD-10-CM | POA: Diagnosis not present

## 2017-10-19 DIAGNOSIS — M25651 Stiffness of right hip, not elsewhere classified: Secondary | ICD-10-CM | POA: Diagnosis not present

## 2017-10-19 DIAGNOSIS — M25661 Stiffness of right knee, not elsewhere classified: Secondary | ICD-10-CM | POA: Diagnosis not present

## 2017-10-19 DIAGNOSIS — M6281 Muscle weakness (generalized): Secondary | ICD-10-CM | POA: Diagnosis not present

## 2017-10-22 DIAGNOSIS — R2689 Other abnormalities of gait and mobility: Secondary | ICD-10-CM | POA: Diagnosis not present

## 2017-10-22 DIAGNOSIS — M25661 Stiffness of right knee, not elsewhere classified: Secondary | ICD-10-CM | POA: Diagnosis not present

## 2017-10-22 DIAGNOSIS — M6281 Muscle weakness (generalized): Secondary | ICD-10-CM | POA: Diagnosis not present

## 2017-10-22 DIAGNOSIS — M25651 Stiffness of right hip, not elsewhere classified: Secondary | ICD-10-CM | POA: Diagnosis not present

## 2017-10-22 DIAGNOSIS — M25652 Stiffness of left hip, not elsewhere classified: Secondary | ICD-10-CM | POA: Diagnosis not present

## 2017-10-22 DIAGNOSIS — M25662 Stiffness of left knee, not elsewhere classified: Secondary | ICD-10-CM | POA: Diagnosis not present

## 2017-10-22 DIAGNOSIS — M4726 Other spondylosis with radiculopathy, lumbar region: Secondary | ICD-10-CM | POA: Diagnosis not present

## 2017-10-24 DIAGNOSIS — M6281 Muscle weakness (generalized): Secondary | ICD-10-CM | POA: Diagnosis not present

## 2017-10-24 DIAGNOSIS — M25662 Stiffness of left knee, not elsewhere classified: Secondary | ICD-10-CM | POA: Diagnosis not present

## 2017-10-24 DIAGNOSIS — M25661 Stiffness of right knee, not elsewhere classified: Secondary | ICD-10-CM | POA: Diagnosis not present

## 2017-10-24 DIAGNOSIS — M25651 Stiffness of right hip, not elsewhere classified: Secondary | ICD-10-CM | POA: Diagnosis not present

## 2017-10-24 DIAGNOSIS — M25652 Stiffness of left hip, not elsewhere classified: Secondary | ICD-10-CM | POA: Diagnosis not present

## 2017-10-24 DIAGNOSIS — R2689 Other abnormalities of gait and mobility: Secondary | ICD-10-CM | POA: Diagnosis not present

## 2017-10-24 DIAGNOSIS — M4726 Other spondylosis with radiculopathy, lumbar region: Secondary | ICD-10-CM | POA: Diagnosis not present

## 2017-10-26 DIAGNOSIS — M25662 Stiffness of left knee, not elsewhere classified: Secondary | ICD-10-CM | POA: Diagnosis not present

## 2017-10-26 DIAGNOSIS — M25651 Stiffness of right hip, not elsewhere classified: Secondary | ICD-10-CM | POA: Diagnosis not present

## 2017-10-26 DIAGNOSIS — R2689 Other abnormalities of gait and mobility: Secondary | ICD-10-CM | POA: Diagnosis not present

## 2017-10-26 DIAGNOSIS — M25652 Stiffness of left hip, not elsewhere classified: Secondary | ICD-10-CM | POA: Diagnosis not present

## 2017-10-26 DIAGNOSIS — M6281 Muscle weakness (generalized): Secondary | ICD-10-CM | POA: Diagnosis not present

## 2017-10-26 DIAGNOSIS — M25661 Stiffness of right knee, not elsewhere classified: Secondary | ICD-10-CM | POA: Diagnosis not present

## 2017-10-26 DIAGNOSIS — M4726 Other spondylosis with radiculopathy, lumbar region: Secondary | ICD-10-CM | POA: Diagnosis not present

## 2017-10-29 DIAGNOSIS — M4726 Other spondylosis with radiculopathy, lumbar region: Secondary | ICD-10-CM | POA: Diagnosis not present

## 2017-10-29 DIAGNOSIS — M6281 Muscle weakness (generalized): Secondary | ICD-10-CM | POA: Diagnosis not present

## 2017-10-29 DIAGNOSIS — R2689 Other abnormalities of gait and mobility: Secondary | ICD-10-CM | POA: Diagnosis not present

## 2017-10-29 DIAGNOSIS — M25662 Stiffness of left knee, not elsewhere classified: Secondary | ICD-10-CM | POA: Diagnosis not present

## 2017-10-29 DIAGNOSIS — M25661 Stiffness of right knee, not elsewhere classified: Secondary | ICD-10-CM | POA: Diagnosis not present

## 2017-10-29 DIAGNOSIS — M25652 Stiffness of left hip, not elsewhere classified: Secondary | ICD-10-CM | POA: Diagnosis not present

## 2017-10-29 DIAGNOSIS — M25651 Stiffness of right hip, not elsewhere classified: Secondary | ICD-10-CM | POA: Diagnosis not present

## 2017-10-31 DIAGNOSIS — R2689 Other abnormalities of gait and mobility: Secondary | ICD-10-CM | POA: Diagnosis not present

## 2017-10-31 DIAGNOSIS — M25651 Stiffness of right hip, not elsewhere classified: Secondary | ICD-10-CM | POA: Diagnosis not present

## 2017-10-31 DIAGNOSIS — M25652 Stiffness of left hip, not elsewhere classified: Secondary | ICD-10-CM | POA: Diagnosis not present

## 2017-10-31 DIAGNOSIS — M6281 Muscle weakness (generalized): Secondary | ICD-10-CM | POA: Diagnosis not present

## 2017-10-31 DIAGNOSIS — M25662 Stiffness of left knee, not elsewhere classified: Secondary | ICD-10-CM | POA: Diagnosis not present

## 2017-10-31 DIAGNOSIS — M25661 Stiffness of right knee, not elsewhere classified: Secondary | ICD-10-CM | POA: Diagnosis not present

## 2017-10-31 DIAGNOSIS — M4726 Other spondylosis with radiculopathy, lumbar region: Secondary | ICD-10-CM | POA: Diagnosis not present

## 2017-11-02 DIAGNOSIS — M25652 Stiffness of left hip, not elsewhere classified: Secondary | ICD-10-CM | POA: Diagnosis not present

## 2017-11-02 DIAGNOSIS — R2689 Other abnormalities of gait and mobility: Secondary | ICD-10-CM | POA: Diagnosis not present

## 2017-11-02 DIAGNOSIS — M6281 Muscle weakness (generalized): Secondary | ICD-10-CM | POA: Diagnosis not present

## 2017-11-02 DIAGNOSIS — M4726 Other spondylosis with radiculopathy, lumbar region: Secondary | ICD-10-CM | POA: Diagnosis not present

## 2017-11-02 DIAGNOSIS — M25662 Stiffness of left knee, not elsewhere classified: Secondary | ICD-10-CM | POA: Diagnosis not present

## 2017-11-02 DIAGNOSIS — M25661 Stiffness of right knee, not elsewhere classified: Secondary | ICD-10-CM | POA: Diagnosis not present

## 2017-11-02 DIAGNOSIS — M25651 Stiffness of right hip, not elsewhere classified: Secondary | ICD-10-CM | POA: Diagnosis not present

## 2017-11-05 DIAGNOSIS — M25652 Stiffness of left hip, not elsewhere classified: Secondary | ICD-10-CM | POA: Diagnosis not present

## 2017-11-05 DIAGNOSIS — M25651 Stiffness of right hip, not elsewhere classified: Secondary | ICD-10-CM | POA: Diagnosis not present

## 2017-11-05 DIAGNOSIS — M4726 Other spondylosis with radiculopathy, lumbar region: Secondary | ICD-10-CM | POA: Diagnosis not present

## 2017-11-05 DIAGNOSIS — M25662 Stiffness of left knee, not elsewhere classified: Secondary | ICD-10-CM | POA: Diagnosis not present

## 2017-11-05 DIAGNOSIS — M6281 Muscle weakness (generalized): Secondary | ICD-10-CM | POA: Diagnosis not present

## 2017-11-05 DIAGNOSIS — R2689 Other abnormalities of gait and mobility: Secondary | ICD-10-CM | POA: Diagnosis not present

## 2017-11-05 DIAGNOSIS — M25661 Stiffness of right knee, not elsewhere classified: Secondary | ICD-10-CM | POA: Diagnosis not present

## 2017-11-09 DIAGNOSIS — M25652 Stiffness of left hip, not elsewhere classified: Secondary | ICD-10-CM | POA: Diagnosis not present

## 2017-11-09 DIAGNOSIS — M6281 Muscle weakness (generalized): Secondary | ICD-10-CM | POA: Diagnosis not present

## 2017-11-09 DIAGNOSIS — R2689 Other abnormalities of gait and mobility: Secondary | ICD-10-CM | POA: Diagnosis not present

## 2017-11-09 DIAGNOSIS — M25651 Stiffness of right hip, not elsewhere classified: Secondary | ICD-10-CM | POA: Diagnosis not present

## 2017-11-09 DIAGNOSIS — M25661 Stiffness of right knee, not elsewhere classified: Secondary | ICD-10-CM | POA: Diagnosis not present

## 2017-11-09 DIAGNOSIS — M4726 Other spondylosis with radiculopathy, lumbar region: Secondary | ICD-10-CM | POA: Diagnosis not present

## 2017-11-09 DIAGNOSIS — M25662 Stiffness of left knee, not elsewhere classified: Secondary | ICD-10-CM | POA: Diagnosis not present

## 2017-11-12 DIAGNOSIS — M25662 Stiffness of left knee, not elsewhere classified: Secondary | ICD-10-CM | POA: Diagnosis not present

## 2017-11-12 DIAGNOSIS — M6281 Muscle weakness (generalized): Secondary | ICD-10-CM | POA: Diagnosis not present

## 2017-11-12 DIAGNOSIS — R2689 Other abnormalities of gait and mobility: Secondary | ICD-10-CM | POA: Diagnosis not present

## 2017-11-12 DIAGNOSIS — M25661 Stiffness of right knee, not elsewhere classified: Secondary | ICD-10-CM | POA: Diagnosis not present

## 2017-11-12 DIAGNOSIS — M25651 Stiffness of right hip, not elsewhere classified: Secondary | ICD-10-CM | POA: Diagnosis not present

## 2017-11-12 DIAGNOSIS — M4726 Other spondylosis with radiculopathy, lumbar region: Secondary | ICD-10-CM | POA: Diagnosis not present

## 2017-11-12 DIAGNOSIS — M25652 Stiffness of left hip, not elsewhere classified: Secondary | ICD-10-CM | POA: Diagnosis not present

## 2017-11-13 DIAGNOSIS — Z1339 Encounter for screening examination for other mental health and behavioral disorders: Secondary | ICD-10-CM | POA: Diagnosis not present

## 2017-11-13 DIAGNOSIS — I4891 Unspecified atrial fibrillation: Secondary | ICD-10-CM | POA: Diagnosis not present

## 2017-11-13 DIAGNOSIS — R911 Solitary pulmonary nodule: Secondary | ICD-10-CM | POA: Diagnosis not present

## 2017-11-13 DIAGNOSIS — Z23 Encounter for immunization: Secondary | ICD-10-CM | POA: Diagnosis not present

## 2017-11-13 DIAGNOSIS — J449 Chronic obstructive pulmonary disease, unspecified: Secondary | ICD-10-CM | POA: Diagnosis not present

## 2017-11-13 DIAGNOSIS — J309 Allergic rhinitis, unspecified: Secondary | ICD-10-CM | POA: Diagnosis not present

## 2017-11-13 DIAGNOSIS — K922 Gastrointestinal hemorrhage, unspecified: Secondary | ICD-10-CM | POA: Diagnosis not present

## 2017-11-13 DIAGNOSIS — M159 Polyosteoarthritis, unspecified: Secondary | ICD-10-CM | POA: Diagnosis not present

## 2017-11-13 DIAGNOSIS — R600 Localized edema: Secondary | ICD-10-CM | POA: Diagnosis not present

## 2017-11-13 DIAGNOSIS — N183 Chronic kidney disease, stage 3 (moderate): Secondary | ICD-10-CM | POA: Diagnosis not present

## 2017-11-19 DIAGNOSIS — M25651 Stiffness of right hip, not elsewhere classified: Secondary | ICD-10-CM | POA: Diagnosis not present

## 2017-11-19 DIAGNOSIS — R2689 Other abnormalities of gait and mobility: Secondary | ICD-10-CM | POA: Diagnosis not present

## 2017-11-19 DIAGNOSIS — M25661 Stiffness of right knee, not elsewhere classified: Secondary | ICD-10-CM | POA: Diagnosis not present

## 2017-11-19 DIAGNOSIS — M25652 Stiffness of left hip, not elsewhere classified: Secondary | ICD-10-CM | POA: Diagnosis not present

## 2017-11-19 DIAGNOSIS — M25662 Stiffness of left knee, not elsewhere classified: Secondary | ICD-10-CM | POA: Diagnosis not present

## 2017-11-19 DIAGNOSIS — M6281 Muscle weakness (generalized): Secondary | ICD-10-CM | POA: Diagnosis not present

## 2017-11-19 DIAGNOSIS — M4726 Other spondylosis with radiculopathy, lumbar region: Secondary | ICD-10-CM | POA: Diagnosis not present

## 2017-11-23 DIAGNOSIS — R2689 Other abnormalities of gait and mobility: Secondary | ICD-10-CM | POA: Diagnosis not present

## 2017-11-23 DIAGNOSIS — M4726 Other spondylosis with radiculopathy, lumbar region: Secondary | ICD-10-CM | POA: Diagnosis not present

## 2017-11-23 DIAGNOSIS — M25651 Stiffness of right hip, not elsewhere classified: Secondary | ICD-10-CM | POA: Diagnosis not present

## 2017-11-23 DIAGNOSIS — M6281 Muscle weakness (generalized): Secondary | ICD-10-CM | POA: Diagnosis not present

## 2017-11-23 DIAGNOSIS — M25652 Stiffness of left hip, not elsewhere classified: Secondary | ICD-10-CM | POA: Diagnosis not present

## 2017-11-23 DIAGNOSIS — M25661 Stiffness of right knee, not elsewhere classified: Secondary | ICD-10-CM | POA: Diagnosis not present

## 2017-11-23 DIAGNOSIS — M25662 Stiffness of left knee, not elsewhere classified: Secondary | ICD-10-CM | POA: Diagnosis not present

## 2017-11-26 DIAGNOSIS — M6281 Muscle weakness (generalized): Secondary | ICD-10-CM | POA: Diagnosis not present

## 2017-11-26 DIAGNOSIS — R2689 Other abnormalities of gait and mobility: Secondary | ICD-10-CM | POA: Diagnosis not present

## 2017-11-26 DIAGNOSIS — M4726 Other spondylosis with radiculopathy, lumbar region: Secondary | ICD-10-CM | POA: Diagnosis not present

## 2017-11-26 DIAGNOSIS — M25651 Stiffness of right hip, not elsewhere classified: Secondary | ICD-10-CM | POA: Diagnosis not present

## 2017-11-26 DIAGNOSIS — M25652 Stiffness of left hip, not elsewhere classified: Secondary | ICD-10-CM | POA: Diagnosis not present

## 2017-11-26 DIAGNOSIS — M25662 Stiffness of left knee, not elsewhere classified: Secondary | ICD-10-CM | POA: Diagnosis not present

## 2017-11-26 DIAGNOSIS — M25661 Stiffness of right knee, not elsewhere classified: Secondary | ICD-10-CM | POA: Diagnosis not present

## 2017-11-30 DIAGNOSIS — M4726 Other spondylosis with radiculopathy, lumbar region: Secondary | ICD-10-CM | POA: Diagnosis not present

## 2017-11-30 DIAGNOSIS — R2689 Other abnormalities of gait and mobility: Secondary | ICD-10-CM | POA: Diagnosis not present

## 2017-11-30 DIAGNOSIS — M25661 Stiffness of right knee, not elsewhere classified: Secondary | ICD-10-CM | POA: Diagnosis not present

## 2017-11-30 DIAGNOSIS — M25662 Stiffness of left knee, not elsewhere classified: Secondary | ICD-10-CM | POA: Diagnosis not present

## 2017-11-30 DIAGNOSIS — M6281 Muscle weakness (generalized): Secondary | ICD-10-CM | POA: Diagnosis not present

## 2017-11-30 DIAGNOSIS — M25651 Stiffness of right hip, not elsewhere classified: Secondary | ICD-10-CM | POA: Diagnosis not present

## 2017-11-30 DIAGNOSIS — M25652 Stiffness of left hip, not elsewhere classified: Secondary | ICD-10-CM | POA: Diagnosis not present

## 2017-12-14 DIAGNOSIS — M4726 Other spondylosis with radiculopathy, lumbar region: Secondary | ICD-10-CM | POA: Diagnosis not present

## 2017-12-14 DIAGNOSIS — M25651 Stiffness of right hip, not elsewhere classified: Secondary | ICD-10-CM | POA: Diagnosis not present

## 2017-12-14 DIAGNOSIS — M25661 Stiffness of right knee, not elsewhere classified: Secondary | ICD-10-CM | POA: Diagnosis not present

## 2017-12-14 DIAGNOSIS — M25652 Stiffness of left hip, not elsewhere classified: Secondary | ICD-10-CM | POA: Diagnosis not present

## 2017-12-14 DIAGNOSIS — R2689 Other abnormalities of gait and mobility: Secondary | ICD-10-CM | POA: Diagnosis not present

## 2017-12-14 DIAGNOSIS — M6281 Muscle weakness (generalized): Secondary | ICD-10-CM | POA: Diagnosis not present

## 2017-12-14 DIAGNOSIS — M25662 Stiffness of left knee, not elsewhere classified: Secondary | ICD-10-CM | POA: Diagnosis not present

## 2017-12-19 DIAGNOSIS — M6281 Muscle weakness (generalized): Secondary | ICD-10-CM | POA: Diagnosis not present

## 2017-12-19 DIAGNOSIS — M4726 Other spondylosis with radiculopathy, lumbar region: Secondary | ICD-10-CM | POA: Diagnosis not present

## 2017-12-19 DIAGNOSIS — M25661 Stiffness of right knee, not elsewhere classified: Secondary | ICD-10-CM | POA: Diagnosis not present

## 2017-12-19 DIAGNOSIS — M25651 Stiffness of right hip, not elsewhere classified: Secondary | ICD-10-CM | POA: Diagnosis not present

## 2017-12-19 DIAGNOSIS — M25652 Stiffness of left hip, not elsewhere classified: Secondary | ICD-10-CM | POA: Diagnosis not present

## 2017-12-19 DIAGNOSIS — R2689 Other abnormalities of gait and mobility: Secondary | ICD-10-CM | POA: Diagnosis not present

## 2017-12-19 DIAGNOSIS — M25662 Stiffness of left knee, not elsewhere classified: Secondary | ICD-10-CM | POA: Diagnosis not present

## 2017-12-21 DIAGNOSIS — R2689 Other abnormalities of gait and mobility: Secondary | ICD-10-CM | POA: Diagnosis not present

## 2017-12-21 DIAGNOSIS — M25661 Stiffness of right knee, not elsewhere classified: Secondary | ICD-10-CM | POA: Diagnosis not present

## 2017-12-21 DIAGNOSIS — M25652 Stiffness of left hip, not elsewhere classified: Secondary | ICD-10-CM | POA: Diagnosis not present

## 2017-12-21 DIAGNOSIS — M6281 Muscle weakness (generalized): Secondary | ICD-10-CM | POA: Diagnosis not present

## 2017-12-21 DIAGNOSIS — M25651 Stiffness of right hip, not elsewhere classified: Secondary | ICD-10-CM | POA: Diagnosis not present

## 2017-12-21 DIAGNOSIS — M4726 Other spondylosis with radiculopathy, lumbar region: Secondary | ICD-10-CM | POA: Diagnosis not present

## 2017-12-21 DIAGNOSIS — M25662 Stiffness of left knee, not elsewhere classified: Secondary | ICD-10-CM | POA: Diagnosis not present

## 2017-12-24 DIAGNOSIS — M25651 Stiffness of right hip, not elsewhere classified: Secondary | ICD-10-CM | POA: Diagnosis not present

## 2017-12-24 DIAGNOSIS — M6281 Muscle weakness (generalized): Secondary | ICD-10-CM | POA: Diagnosis not present

## 2017-12-24 DIAGNOSIS — R2689 Other abnormalities of gait and mobility: Secondary | ICD-10-CM | POA: Diagnosis not present

## 2017-12-24 DIAGNOSIS — M25661 Stiffness of right knee, not elsewhere classified: Secondary | ICD-10-CM | POA: Diagnosis not present

## 2017-12-24 DIAGNOSIS — M4726 Other spondylosis with radiculopathy, lumbar region: Secondary | ICD-10-CM | POA: Diagnosis not present

## 2017-12-24 DIAGNOSIS — M25652 Stiffness of left hip, not elsewhere classified: Secondary | ICD-10-CM | POA: Diagnosis not present

## 2017-12-24 DIAGNOSIS — M25662 Stiffness of left knee, not elsewhere classified: Secondary | ICD-10-CM | POA: Diagnosis not present

## 2017-12-28 DIAGNOSIS — M25662 Stiffness of left knee, not elsewhere classified: Secondary | ICD-10-CM | POA: Diagnosis not present

## 2017-12-28 DIAGNOSIS — M4726 Other spondylosis with radiculopathy, lumbar region: Secondary | ICD-10-CM | POA: Diagnosis not present

## 2017-12-28 DIAGNOSIS — M6281 Muscle weakness (generalized): Secondary | ICD-10-CM | POA: Diagnosis not present

## 2017-12-28 DIAGNOSIS — M25651 Stiffness of right hip, not elsewhere classified: Secondary | ICD-10-CM | POA: Diagnosis not present

## 2017-12-28 DIAGNOSIS — M25661 Stiffness of right knee, not elsewhere classified: Secondary | ICD-10-CM | POA: Diagnosis not present

## 2017-12-28 DIAGNOSIS — R2689 Other abnormalities of gait and mobility: Secondary | ICD-10-CM | POA: Diagnosis not present

## 2017-12-28 DIAGNOSIS — M25652 Stiffness of left hip, not elsewhere classified: Secondary | ICD-10-CM | POA: Diagnosis not present

## 2018-02-21 DIAGNOSIS — I10 Essential (primary) hypertension: Secondary | ICD-10-CM | POA: Diagnosis not present

## 2018-02-21 DIAGNOSIS — I129 Hypertensive chronic kidney disease with stage 1 through stage 4 chronic kidney disease, or unspecified chronic kidney disease: Secondary | ICD-10-CM | POA: Diagnosis not present

## 2018-02-21 DIAGNOSIS — K219 Gastro-esophageal reflux disease without esophagitis: Secondary | ICD-10-CM | POA: Diagnosis not present

## 2018-02-21 DIAGNOSIS — R0902 Hypoxemia: Secondary | ICD-10-CM | POA: Diagnosis not present

## 2018-02-21 DIAGNOSIS — E039 Hypothyroidism, unspecified: Secondary | ICD-10-CM | POA: Diagnosis not present

## 2018-02-21 DIAGNOSIS — R079 Chest pain, unspecified: Secondary | ICD-10-CM | POA: Diagnosis not present

## 2018-02-21 DIAGNOSIS — Z87891 Personal history of nicotine dependence: Secondary | ICD-10-CM | POA: Diagnosis not present

## 2018-02-21 DIAGNOSIS — J449 Chronic obstructive pulmonary disease, unspecified: Secondary | ICD-10-CM | POA: Diagnosis not present

## 2018-02-21 DIAGNOSIS — I4891 Unspecified atrial fibrillation: Secondary | ICD-10-CM | POA: Diagnosis not present

## 2018-02-21 DIAGNOSIS — R51 Headache: Secondary | ICD-10-CM | POA: Diagnosis not present

## 2018-02-21 DIAGNOSIS — Z7901 Long term (current) use of anticoagulants: Secondary | ICD-10-CM | POA: Diagnosis not present

## 2018-02-21 DIAGNOSIS — N189 Chronic kidney disease, unspecified: Secondary | ICD-10-CM | POA: Diagnosis not present

## 2018-02-21 DIAGNOSIS — R42 Dizziness and giddiness: Secondary | ICD-10-CM | POA: Diagnosis not present

## 2018-02-21 DIAGNOSIS — R0789 Other chest pain: Secondary | ICD-10-CM | POA: Diagnosis not present

## 2018-02-26 DIAGNOSIS — I4891 Unspecified atrial fibrillation: Secondary | ICD-10-CM | POA: Diagnosis not present

## 2018-02-26 DIAGNOSIS — K922 Gastrointestinal hemorrhage, unspecified: Secondary | ICD-10-CM | POA: Diagnosis not present

## 2018-02-26 DIAGNOSIS — R600 Localized edema: Secondary | ICD-10-CM | POA: Diagnosis not present

## 2018-02-26 DIAGNOSIS — R42 Dizziness and giddiness: Secondary | ICD-10-CM | POA: Diagnosis not present

## 2018-02-26 DIAGNOSIS — J449 Chronic obstructive pulmonary disease, unspecified: Secondary | ICD-10-CM | POA: Diagnosis not present

## 2018-02-26 DIAGNOSIS — N183 Chronic kidney disease, stage 3 (moderate): Secondary | ICD-10-CM | POA: Diagnosis not present

## 2018-02-26 DIAGNOSIS — J309 Allergic rhinitis, unspecified: Secondary | ICD-10-CM | POA: Diagnosis not present

## 2018-02-26 DIAGNOSIS — M109 Gout, unspecified: Secondary | ICD-10-CM | POA: Diagnosis not present

## 2018-02-26 DIAGNOSIS — R911 Solitary pulmonary nodule: Secondary | ICD-10-CM | POA: Diagnosis not present

## 2018-02-26 DIAGNOSIS — M159 Polyosteoarthritis, unspecified: Secondary | ICD-10-CM | POA: Diagnosis not present

## 2018-03-08 ENCOUNTER — Ambulatory Visit (INDEPENDENT_AMBULATORY_CARE_PROVIDER_SITE_OTHER): Payer: Medicare HMO | Admitting: Cardiology

## 2018-03-08 ENCOUNTER — Encounter: Payer: Self-pay | Admitting: Cardiology

## 2018-03-08 VITALS — BP 112/58 | HR 105 | Ht 70.0 in

## 2018-03-08 DIAGNOSIS — I10 Essential (primary) hypertension: Secondary | ICD-10-CM | POA: Diagnosis not present

## 2018-03-08 DIAGNOSIS — I480 Paroxysmal atrial fibrillation: Secondary | ICD-10-CM | POA: Diagnosis not present

## 2018-03-08 DIAGNOSIS — E7800 Pure hypercholesterolemia, unspecified: Secondary | ICD-10-CM | POA: Diagnosis not present

## 2018-03-08 MED ORDER — APIXABAN 2.5 MG PO TABS: 2.5000 mg | ORAL_TABLET | Freq: Two times a day (BID) | ORAL | 1 refills | Status: AC

## 2018-03-08 NOTE — Progress Notes (Signed)
Cardiology Office Note:    Date:  03/08/2018   ID:  Jared Ross, DOB 1922/06/15, MRN [id number]  PCP:  Cher Nakai, MD  Cardiologist:  Jenean Lindau, MD   Referring MD: Cher Nakai, MD    ASSESSMENT:    1. PAF (paroxysmal atrial fibrillation) ([place])   2. Essential hypertension   3. Hypercholesterolemia    PLAN:    In order of problems listed above:  1. Primary prevention stressed with the patient.  Importance of compliance with diet and medication stressed and he vocalized understanding.  His blood pressure is stable. 2. I discussed with the patient atrial fibrillation, disease process. Management and therapy including rate and rhythm control, anticoagulation benefits and potential risks were discussed extensively with the patient. Patient had multiple questions which were answered to patient's satisfaction. 3. I will continue his anticoagulation.  We will give him an ifob to check stool for occult blood just to make sure he has not any issues with GI blood loss 4. Patient will be seen in follow-up appointment in 6 months or earlier if the patient has any concerns 5. Lipids are followed by his primary care physician.   Medication Adjustments/Labs and Tests Ordered: Current medicines are reviewed at length with the patient today.  Concerns regarding medicines are outlined above.  No orders of the defined types were placed in this encounter.  No orders of the defined types were placed in this encounter.    No chief complaint on file.    History of Present Illness:    Jared Ross is a 85 y.o. male.  Patient has past medical history of essential hypertension, paroxysmal atrial fibrillation and dyslipidemia.  He essentially is in a wheelchair because his wife mentions to me that he cannot walk.  He has not had a fall in the recent past or even several months as far as the wife can remember.  No chest pain orthopnea or PND.  This patient has been under my care in my previous  practice.  He is here now to transfer his care and be established with my current practice.  The patient denies any palpitations or any such problems.  I do not have any documented atrial fibrillation in the chart and I will try to obtain those records from our there cardiology colleagues in town.  Past Medical History:  Diagnosis Date  . Adult BMI 27.0-27.9 kg/sq m   . Allergic rhinitis   . Atrial fibrillation ([place])   . Chronic kidney disease   . Diverticulosis   . Esophageal reflux   . Generalized osteoarthritis   . Gout   . HTN (hypertension)   . Hypercholesterolemia   . Hypertrophic prostatitis   . Hypothyroidism   . Neuralgia and neuritis     Past Surgical History:  Procedure Laterality Date  . APPENDECTOMY      Current Medications: Current Meds  Medication Sig  . apixaban (ELIQUIS) 2.5 MG TABS tablet Take 2.5 mg by mouth 2 (two) times daily.  Marland Kitchen atorvastatin (LIPITOR) 20 MG tablet Take 20 mg by mouth daily.  Marland Kitchen docusate sodium (COLACE) 100 MG capsule Take 1 capsule by mouth 2 (two) times daily.  Marland Kitchen levothyroxine (SYNTHROID, LEVOTHROID) 25 MCG tablet Take 25 mcg by mouth daily before breakfast.  . meclizine (ANTIVERT) 25 MG tablet Take 1 tablet by mouth as needed.  Marland Kitchen oxyCODONE (OXY IR/ROXICODONE) 5 MG immediate release tablet Take 1 tablet by mouth daily.  Marland Kitchen senna (SENOKOT) 8.6 MG tablet Take 1  tablet by mouth daily.  . traMADol (ULTRAM) 50 MG tablet Take 50 mg by mouth.     Allergies:   Lidocaine and Penicillins   Social History   Socioeconomic History  . Marital status: Married    Spouse name: Not on file  . Number of children: Not on file  . Years of education: Not on file  . Highest education level: Not on file  Occupational History  . Not on file  Social Needs  . Financial resource strain: Not on file  . Food insecurity:    Worry: Not on file    Inability: Not on file  . Transportation needs:    Medical: Not on file    Non-medical: Not on file  Tobacco  Use  . Smoking status: Former Smoker    Packs/day: 1.00    Years: 30.00    Pack years: 30.00    Types: Cigarettes    Last attempt to quit: 08/04/1975    Years since quitting: 42.6  . Smokeless tobacco: Never Used  Substance and Sexual Activity  . Alcohol use: No  . Drug use: No  . Sexual activity: Not on file  Lifestyle  . Physical activity:    Days per week: Not on file    Minutes per session: Not on file  . Stress: Not on file  Relationships  . Social connections:    Talks on phone: Not on file    Gets together: Not on file    Attends religious service: Not on file    Active member of club or organization: Not on file    Attends meetings of clubs or organizations: Not on file    Relationship status: Not on file  Other Topics Concern  . Not on file  Social History Narrative   Lives with wife.   Retired x 26 yrs.    Worked in [employer].     Family History: The patient's family history includes Coronary artery disease in his father and mother; Down syndrome in his son; Pulmonary embolism in his sister.  ROS:   Please see the history of present illness.    All other systems reviewed and are negative.  EKGs/Labs/Other Studies Reviewed:    The following studies were reviewed today: I reviewed hospital records and EKG reveals sinus rhythm with nonspecific ST-T changes there is a mention of atrial fibrillation but I am not convinced.  His creatinine is also within normal limits.   Recent Labs: No results found for requested labs within last 8760 hours.  Recent Lipid Panel No results found for: CHOL, TRIG, HDL, CHOLHDL, VLDL, LDLCALC, LDLDIRECT  Physical Exam:    VS:  BP (!) 112/58 (BP Location: Right Arm, Patient Position: Sitting, Cuff Size: Normal)   Pulse (!) 105   Ht 5\' 10"  (1.778 m)   SpO2 93%   BMI 25.25 kg/m     Wt Readings from Last 3 Encounters:  12/16/13 176 lb (79.8 kg)  09/02/13 180 lb 12.8 oz (82 kg)     GEN: Patient is in no  acute distress HEENT: Normal NECK: No JVD; No carotid bruits LYMPHATICS: No lymphadenopathy CARDIAC: Hear sounds regular, 2/6 systolic murmur at the apex. RESPIRATORY:  Clear to auscultation without rales, wheezing or rhonchi  ABDOMEN: Soft, non-tender, non-distended MUSCULOSKELETAL:  No edema; No deformity  SKIN: Warm and dry NEUROLOGIC:  Alert and oriented x 3 PSYCHIATRIC:  Normal affect   Signed, Jenean Lindau, MD  03/08/2018 10:43 AM  [facility]

## 2018-03-08 NOTE — Patient Instructions (Signed)
Medication Instructions:  Your physician recommends that you continue on your current medications as directed. Please refer to the Current Medication list given to you today.  If you need a refill on your cardiac medications before your next appointment, please call your pharmacy.   Lab work: Your physician recommends that you return for lab work today: stool sample If you have labs (blood work) drawn today and your tests are completely normal, you will receive your results only by: Marland Kitchen MyChart Message (if you have MyChart) OR . A paper copy in the mail If you have any lab test that is abnormal or we need to change your treatment, we will call you to review the results.  Testing/Procedures: None.   Follow-Up: At [facility], you and your health needs are our priority.  As part of our continuing mission to provide you with exceptional heart care, we have created designated Provider Care Teams.  These Care Teams include your primary Cardiologist (physician) and Advanced Practice Providers (APPs -  Physician Assistants and Nurse Practitioners) who all work together to provide you with the care you need, when you need it. You will need a follow up appointment in 6 months.  Please call our office 2 months in advance to schedule this appointment.  You may see No primary care provider on file. or another member of our [employer] Provider Team in [place]: Jenne Campus, MD . Shirlee More, MD  Any Other Special Instructions Will Be Listed Below (If Applicable).

## 2018-03-11 DIAGNOSIS — H6092 Unspecified otitis externa, left ear: Secondary | ICD-10-CM | POA: Diagnosis not present

## 2018-03-11 DIAGNOSIS — K922 Gastrointestinal hemorrhage, unspecified: Secondary | ICD-10-CM | POA: Diagnosis not present

## 2018-03-11 DIAGNOSIS — J449 Chronic obstructive pulmonary disease, unspecified: Secondary | ICD-10-CM | POA: Diagnosis not present

## 2018-03-11 DIAGNOSIS — N183 Chronic kidney disease, stage 3 (moderate): Secondary | ICD-10-CM | POA: Diagnosis not present

## 2018-03-11 DIAGNOSIS — R911 Solitary pulmonary nodule: Secondary | ICD-10-CM | POA: Diagnosis not present

## 2018-03-11 DIAGNOSIS — K219 Gastro-esophageal reflux disease without esophagitis: Secondary | ICD-10-CM | POA: Diagnosis not present

## 2018-03-11 DIAGNOSIS — M109 Gout, unspecified: Secondary | ICD-10-CM | POA: Diagnosis not present

## 2018-03-11 DIAGNOSIS — R600 Localized edema: Secondary | ICD-10-CM | POA: Diagnosis not present

## 2018-03-11 DIAGNOSIS — J309 Allergic rhinitis, unspecified: Secondary | ICD-10-CM | POA: Diagnosis not present

## 2018-03-11 DIAGNOSIS — I4891 Unspecified atrial fibrillation: Secondary | ICD-10-CM | POA: Diagnosis not present

## 2018-03-13 DIAGNOSIS — I480 Paroxysmal atrial fibrillation: Secondary | ICD-10-CM | POA: Diagnosis not present

## 2018-03-17 LAB — FECAL OCCULT BLOOD, IMMUNOCHEMICAL: Fecal Occult Bld: NEGATIVE

## 2018-03-18 ENCOUNTER — Telehealth: Payer: Self-pay

## 2018-03-18 NOTE — Telephone Encounter (Signed)
-----   Message from Jenean Lindau, MD sent at 03/18/2018  8:17 AM EST ----- The results of the study is unremarkable. Please inform patient. I will discuss in detail at next appointment. Cc  primary care/referring physician Jenean Lindau, MD 03/18/2018 8:16 AM

## 2018-03-18 NOTE — Telephone Encounter (Signed)
Called patient and left detailed voice message on patients phone regarding lab results. 

## 2018-03-28 DIAGNOSIS — M109 Gout, unspecified: Secondary | ICD-10-CM | POA: Diagnosis not present

## 2018-03-28 DIAGNOSIS — N183 Chronic kidney disease, stage 3 (moderate): Secondary | ICD-10-CM | POA: Diagnosis not present

## 2018-03-28 DIAGNOSIS — E039 Hypothyroidism, unspecified: Secondary | ICD-10-CM | POA: Diagnosis not present

## 2018-03-28 DIAGNOSIS — E7800 Pure hypercholesterolemia, unspecified: Secondary | ICD-10-CM | POA: Diagnosis not present

## 2018-03-28 DIAGNOSIS — K922 Gastrointestinal hemorrhage, unspecified: Secondary | ICD-10-CM | POA: Diagnosis not present

## 2018-03-28 DIAGNOSIS — R911 Solitary pulmonary nodule: Secondary | ICD-10-CM | POA: Diagnosis not present

## 2018-03-28 DIAGNOSIS — K219 Gastro-esophageal reflux disease without esophagitis: Secondary | ICD-10-CM | POA: Diagnosis not present

## 2018-03-28 DIAGNOSIS — M159 Polyosteoarthritis, unspecified: Secondary | ICD-10-CM | POA: Diagnosis not present

## 2018-03-28 DIAGNOSIS — I10 Essential (primary) hypertension: Secondary | ICD-10-CM | POA: Diagnosis not present

## 2018-03-28 DIAGNOSIS — J309 Allergic rhinitis, unspecified: Secondary | ICD-10-CM | POA: Diagnosis not present

## 2018-03-28 DIAGNOSIS — R600 Localized edema: Secondary | ICD-10-CM | POA: Diagnosis not present

## 2018-03-28 DIAGNOSIS — M5417 Radiculopathy, lumbosacral region: Secondary | ICD-10-CM | POA: Diagnosis not present

## 2018-09-30 DIAGNOSIS — I10 Essential (primary) hypertension: Secondary | ICD-10-CM | POA: Diagnosis not present

## 2018-09-30 DIAGNOSIS — Z7901 Long term (current) use of anticoagulants: Secondary | ICD-10-CM | POA: Diagnosis not present

## 2018-09-30 DIAGNOSIS — I480 Paroxysmal atrial fibrillation: Secondary | ICD-10-CM | POA: Diagnosis not present

## 2019-01-18 DIAGNOSIS — M5417 Radiculopathy, lumbosacral region: Secondary | ICD-10-CM | POA: Diagnosis not present

## 2019-01-18 DIAGNOSIS — R5381 Other malaise: Secondary | ICD-10-CM | POA: Diagnosis not present

## 2019-01-18 DIAGNOSIS — J309 Allergic rhinitis, unspecified: Secondary | ICD-10-CM | POA: Diagnosis not present

## 2019-01-18 DIAGNOSIS — E039 Hypothyroidism, unspecified: Secondary | ICD-10-CM | POA: Diagnosis not present

## 2019-01-18 DIAGNOSIS — N183 Chronic kidney disease, stage 3 unspecified: Secondary | ICD-10-CM | POA: Diagnosis not present

## 2019-01-18 DIAGNOSIS — Z23 Encounter for immunization: Secondary | ICD-10-CM | POA: Diagnosis not present

## 2019-01-18 DIAGNOSIS — K219 Gastro-esophageal reflux disease without esophagitis: Secondary | ICD-10-CM | POA: Diagnosis not present

## 2019-01-18 DIAGNOSIS — R634 Abnormal weight loss: Secondary | ICD-10-CM | POA: Diagnosis not present

## 2019-01-18 DIAGNOSIS — E7800 Pure hypercholesterolemia, unspecified: Secondary | ICD-10-CM | POA: Diagnosis not present

## 2019-01-18 DIAGNOSIS — R911 Solitary pulmonary nodule: Secondary | ICD-10-CM | POA: Diagnosis not present

## 2019-01-18 DIAGNOSIS — I10 Essential (primary) hypertension: Secondary | ICD-10-CM | POA: Diagnosis not present

## 2019-01-18 DIAGNOSIS — R600 Localized edema: Secondary | ICD-10-CM | POA: Diagnosis not present

## 2019-01-18 DIAGNOSIS — M159 Polyosteoarthritis, unspecified: Secondary | ICD-10-CM | POA: Diagnosis not present

## 2019-02-03 DIAGNOSIS — H348122 Central retinal vein occlusion, left eye, stable: Secondary | ICD-10-CM | POA: Diagnosis not present

## 2019-02-03 DIAGNOSIS — D3132 Benign neoplasm of left choroid: Secondary | ICD-10-CM | POA: Diagnosis not present

## 2019-02-03 DIAGNOSIS — H348111 Central retinal vein occlusion, right eye, with retinal neovascularization: Secondary | ICD-10-CM | POA: Diagnosis not present

## 2019-02-03 DIAGNOSIS — H35031 Hypertensive retinopathy, right eye: Secondary | ICD-10-CM | POA: Diagnosis not present

## 2019-02-17 DIAGNOSIS — H348111 Central retinal vein occlusion, right eye, with retinal neovascularization: Secondary | ICD-10-CM | POA: Diagnosis not present

## 2019-03-03 DIAGNOSIS — H348111 Central retinal vein occlusion, right eye, with retinal neovascularization: Secondary | ICD-10-CM | POA: Diagnosis not present

## 2019-05-19 ENCOUNTER — Encounter (INDEPENDENT_AMBULATORY_CARE_PROVIDER_SITE_OTHER): Payer: Self-pay | Admitting: Ophthalmology

## 2019-05-19 ENCOUNTER — Ambulatory Visit (INDEPENDENT_AMBULATORY_CARE_PROVIDER_SITE_OTHER): Payer: Medicare HMO | Admitting: Ophthalmology

## 2019-05-19 ENCOUNTER — Other Ambulatory Visit: Payer: Self-pay

## 2019-05-19 DIAGNOSIS — H348122 Central retinal vein occlusion, left eye, stable: Secondary | ICD-10-CM

## 2019-05-19 DIAGNOSIS — H35031 Hypertensive retinopathy, right eye: Secondary | ICD-10-CM | POA: Insufficient documentation

## 2019-05-19 DIAGNOSIS — H353134 Nonexudative age-related macular degeneration, bilateral, advanced atrophic with subfoveal involvement: Secondary | ICD-10-CM | POA: Diagnosis not present

## 2019-05-19 DIAGNOSIS — H4051X3 Glaucoma secondary to other eye disorders, right eye, severe stage: Secondary | ICD-10-CM | POA: Diagnosis not present

## 2019-05-19 DIAGNOSIS — H348111 Central retinal vein occlusion, right eye, with retinal neovascularization: Secondary | ICD-10-CM

## 2019-05-19 NOTE — Assessment & Plan Note (Signed)
The nature of central retinal vein occlusion was discussed with the patient including the division of types into nonischemic ischemic. The potential sequelae of ischemic central retinal vein occlusion, including macular edema, neovascularization, rubeosis iridis, and neovascular glaucoma, were discussed, and the need for frequent follow-up.  The nature of macular edema and central retinal vein occlusion was discussed. The following options were considered:  1.Observation for a period to look for spontaneous improvement, is no linger the primary therapy. One-third worsen, one-third stay unchanged, and one-third improves.  2. Anti-VEGF Therapy. ( Lucentis, Avastin or Eylea ) injected  in intravitreal fashion, initially monthly then tailored to clinical response.  3. Intravitreal steroid usage, Kenalog, or Ozurdex, usually a second line therapy or in combination with anti-Vegf therapy noted above.  4. Panretinal laser photocoagulation to cause regression of iris neovascularization, or treat retinal  non-perfusion.  5. Surgical Management may include vitrectomy with incisions of peripheral veins to trigger retino choroidal anastomosis formation. This topic presented and discussed at [place].

## 2019-05-19 NOTE — Assessment & Plan Note (Signed)

## 2019-05-19 NOTE — Progress Notes (Signed)
05/19/2019     CHIEF COMPLAINT Patient presents for Retina Follow Up   HISTORY OF PRESENT ILLNESS: Jared Ross is a 86 y.o. male who presents to the clinic today for:   HPI    Retina Follow Up    Patient presents with  CRVO/BRVO.  In both eyes.  Severity is moderate.  Since onset it is stable.  I, the attending physician,  performed the HPI with the patient and updated documentation appropriately.          Comments    6 Week f/u OU. FP  Pt states OS is still blurry. Pt has had pressure headaches off and on for a few weeks. Sees floaters occasionally. Using gtts as directed       Last edited by Tilda Franco on 05/19/2019  2:21 PM. (History)      Referring physician: Cher Nakai, MD [address],  [place] [address]  HISTORICAL INFORMATION:   Selected notes from the MEDICAL RECORD NUMBER       CURRENT MEDICATIONS: Current Outpatient Medications (Ophthalmic Drugs)  Medication Sig  . brimonidine (ALPHAGAN) 0.15 % ophthalmic solution Place 1 drop into the right eye in the morning and at bedtime.   No current facility-administered medications for this visit. (Ophthalmic Drugs)   Current Outpatient Medications (Other)  Medication Sig  . apixaban (ELIQUIS) 2.5 MG TABS tablet Take 1 tablet (2.5 mg total) by mouth 2 (two) times daily.  Marland Kitchen atorvastatin (LIPITOR) 20 MG tablet Take 20 mg by mouth daily.  Marland Kitchen docusate sodium (COLACE) 100 MG capsule Take 1 capsule by mouth 2 (two) times daily.  Marland Kitchen levothyroxine (SYNTHROID, LEVOTHROID) 25 MCG tablet Take 25 mcg by mouth daily before breakfast.  . meclizine (ANTIVERT) 25 MG tablet Take 1 tablet by mouth as needed.  Marland Kitchen oxyCODONE (OXY IR/ROXICODONE) 5 MG immediate release tablet Take 1 tablet by mouth daily.  Marland Kitchen senna (SENOKOT) 8.6 MG tablet Take 1 tablet by mouth daily.  . traMADol (ULTRAM) 50 MG tablet Take 50 mg by mouth.   No current facility-administered medications for this visit. (Other)      REVIEW OF  SYSTEMS: ROS    Negative for: Constitutional, Gastrointestinal, Neurological, Skin, Genitourinary, Musculoskeletal, HENT, Endocrine, Cardiovascular, Eyes, Respiratory, Psychiatric, Allergic/Imm, Heme/Lymph   Last edited by Tilda Franco on 05/19/2019  2:14 PM. (History)       ALLERGIES Allergies  Allergen Reactions  . Lidocaine Other (See Comments)    blisters  . Penicillins Other (See Comments)    Cold sweat    PAST MEDICAL HISTORY Past Medical History:  Diagnosis Date  . Adult BMI 27.0-27.9 kg/sq m   . Allergic rhinitis   . Atrial fibrillation ([place])   . Chronic kidney disease   . Diverticulosis   . Esophageal reflux   . Generalized osteoarthritis   . Gout   . HTN (hypertension)   . Hypercholesterolemia   . Hypertrophic prostatitis   . Hypothyroidism   . Neuralgia and neuritis    Past Surgical History:  Procedure Laterality Date  . APPENDECTOMY    . CATARACT EXTRACTION Left 02/06/2005  . CATARACT EXTRACTION Right 02/06/2001    FAMILY HISTORY Family History  Problem Relation Age of Onset  . Pulmonary embolism Sister        deceased  . Coronary artery disease Father   . Coronary artery disease Mother   . Down syndrome Son     SOCIAL HISTORY Social History   Tobacco  Use  . Smoking status: Former Smoker    Packs/day: 1.00    Years: 30.00    Pack years: 30.00    Types: Cigarettes    Quit date: 08/04/1975    Years since quitting: 43.8  . Smokeless tobacco: Never Used  Substance Use Topics  . Alcohol use: No  . Drug use: No         OPHTHALMIC EXAM:  Base Eye Exam    Visual Acuity (Snellen - Linear)      Right Left   Dist cc CF @ 1' 20/50 -2   Dist ph cc NI 20/30 -1   Correction: Glasses       Tonometry (Tonopen, 2:25 PM)      Right Left   Pressure 19 14       Pupils      Dark Light Shape React APD   Right 3 2 Round Brisk None   Left 3 2 Round Brisk None       Visual Fields (Counting fingers)      Left Right    Full Full         Neuro/Psych    Oriented x3: Yes   Mood/Affect: Normal       Dilation    Both eyes: 1.0% Mydriacyl, 2.5% Phenylephrine @ 2:25 PM        Slit Lamp and Fundus Exam    Slit Lamp Exam      Right Left   Lids/Lashes Normal Normal   Conjunctiva/Sclera White and quiet White and quiet   Cornea Clear Clear   Anterior Chamber Deep and quiet Deep and quiet   Iris Round and reactive Round and reactive   Lens Posterior chamber intraocular lens Posterior chamber intraocular lens   Anterior Vitreous Normal Normal       Fundus Exam      Right Left   Posterior Vitreous ,,, Central vitreous floaters ,,, Central vitreous floaters, Posterior vitreous detachment   Disc Optic disc atrophy, Thin rim, Hemorrhage,, collaterals early present    C/D Ratio 0.75 0.6   Macula Microaneurysms, Cystoid macular edema minor, with atrophy adjacent.  Large regional chorioretinal atrophy inferior to the macula from prior old CS CR Old macular chorioretinal scars And RPE atrophy   Vessels Old CRV O no N/V Normal   Periphery 8/10, good PRP room temporally for more Normal,, with small choroidal nevus superonasal to nerve, 1 disc diameter, flat, regular pigment, no atrophy          IMAGING AND PROCEDURES  Imaging and Procedures for 05/19/19  OCT, Retina - OU - Both Eyes       Right Eye Quality was good. Scan locations included subfoveal. Progression has been stable. Findings include abnormal foveal contour, subretinal scarring, outer retinal atrophy, central retinal atrophy.   Left Eye Quality was good. Scan locations included subfoveal. Progression has been stable. Findings include abnormal foveal contour, outer retinal atrophy, myopic contour.        Color Fundus Photography Optos - OU - Both Eyes       Right Eye Progression has been stable. Disc findings include notching, pallor.   Left Eye Progression has been stable. Disc findings include normal observations. Macula : normal observations.  Vessels : normal observations.   Notes  OD, old central retinal vein occlusion with an optic nerve pallor and early collaterals on the nerve.  PRP room temporally for more PRP OS small choroidal nevus superonasal to nerve, 1 disc diameter, flat, regular  pigment, no atrophy                 ASSESSMENT/PLAN:  @PROBAPNOTE @    ICD-10-CM   1. Neovascular glaucoma, right eye, severe stage  H40.51X3   2. Central retinal vein occlusion with neovascularization of right eye  H34.8111 OCT, Retina - OU - Both Eyes    Color Fundus Photography Optos - OU - Both Eyes  3. Advanced nonexudative age-related macular degeneration of both eyes with subfoveal involvement  H35.3134 OCT, Retina - OU - Both Eyes    Color Fundus Photography Optos - OU - Both Eyes  4. Central retinal vein occlusion of left eye, unspecified complication status  99991111 OCT, Retina - OU - Both Eyes    Color Fundus Photography Optos - OU - Both Eyes  5. Hypertensive retinopathy of right eye  H35.031     1.  2.  3.  Ophthalmic Meds Ordered this visit:  No orders of the defined types were placed in this encounter.      Return in about 3 months (around 08/18/2019) for COLOR FP.  There are no Patient Instructions on file for this visit.   Explained the diagnoses, plan, and follow up with the patient and they expressed understanding.  Patient expressed understanding of the importance of proper follow up care.   Clent Demark Kamy Poinsett M.D. Diseases & Surgery of the Retina and Vitreous [facility] [place] 05/19/19     Abbreviations: M myopia (nearsighted); A astigmatism; H hyperopia (farsighted); P presbyopia; Mrx spectacle prescription;  CTL contact lenses; OD right eye; OS left eye; OU both eyes  XT exotropia; ET esotropia; PEK punctate epithelial keratitis; PEE punctate epithelial erosions; DES dry eye syndrome; MGD meibomian gland dysfunction; ATs artificial tears; PFAT's preservative free artificial tears;  [place] nuclear sclerotic cataract; PSC posterior subcapsular cataract; ERM epi-retinal membrane; PVD posterior vitreous detachment; RD retinal detachment; DM diabetes mellitus; DR diabetic retinopathy; NPDR non-proliferative diabetic retinopathy; PDR proliferative diabetic retinopathy; CSME clinically significant macular edema; DME diabetic macular edema; dbh dot blot hemorrhages; CWS cotton wool spot; POAG primary open angle glaucoma; C/D cup-to-disc ratio; HVF humphrey visual field; GVF goldmann visual field; OCT optical coherence tomography; IOP intraocular pressure; BRVO Branch retinal vein occlusion; CRVO central retinal vein occlusion; CRAO central retinal artery occlusion; BRAO branch retinal artery occlusion; RT retinal tear; SB scleral buckle; PPV pars plana vitrectomy; VH Vitreous hemorrhage; PRP panretinal laser photocoagulation; IVK intravitreal kenalog; VMT vitreomacular traction; MH Macular hole;  NVD neovascularization of the disc; NVE neovascularization elsewhere; AREDS age related eye disease study; ARMD age related macular degeneration; POAG primary open angle glaucoma; EBMD epithelial/anterior basement membrane dystrophy; ACIOL anterior chamber intraocular lens; IOL intraocular lens; PCIOL posterior chamber intraocular lens; Phaco/IOL phacoemulsification with intraocular lens placement; [place] photorefractive keratectomy; LASIK laser assisted in situ keratomileusis; HTN hypertension; DM diabetes mellitus; COPD chronic obstructive pulmonary disease

## 2019-08-14 DIAGNOSIS — Z8041 Family history of malignant neoplasm of ovary: Secondary | ICD-10-CM

## 2019-08-14 DIAGNOSIS — Z993 Dependence on wheelchair: Secondary | ICD-10-CM

## 2019-08-14 DIAGNOSIS — Z87891 Personal history of nicotine dependence: Secondary | ICD-10-CM

## 2019-08-14 DIAGNOSIS — R9402 Abnormal brain scan: Secondary | ICD-10-CM

## 2019-08-14 DIAGNOSIS — G939 Disorder of brain, unspecified: Secondary | ICD-10-CM

## 2019-08-18 ENCOUNTER — Encounter (INDEPENDENT_AMBULATORY_CARE_PROVIDER_SITE_OTHER): Payer: Medicare HMO | Admitting: Ophthalmology

## 2019-09-01 DIAGNOSIS — C719 Malignant neoplasm of brain, unspecified: Secondary | ICD-10-CM

## 2019-10-08 DEATH — deceased
